# Patient Record
Sex: Male | Born: 1960 | Race: White | Hispanic: No | Smoking: Never smoker
Health system: Southern US, Community
[De-identification: ages and names within clinical notes are randomized; demographics above are authoritative.]

## PROBLEM LIST (undated history)

## (undated) DIAGNOSIS — E1140 Type 2 diabetes mellitus with diabetic neuropathy, unspecified: Secondary | ICD-10-CM

## (undated) DIAGNOSIS — E785 Hyperlipidemia, unspecified: Secondary | ICD-10-CM

## (undated) DIAGNOSIS — E119 Type 2 diabetes mellitus without complications: Secondary | ICD-10-CM

## (undated) DIAGNOSIS — I10 Essential (primary) hypertension: Secondary | ICD-10-CM

---

## 1998-07-01 ENCOUNTER — Encounter: Admission: RE | Admit: 1998-07-01 | Discharge: 1998-09-29 | Payer: Self-pay | Admitting: Family Medicine

## 2001-11-13 ENCOUNTER — Ambulatory Visit (HOSPITAL_COMMUNITY): Admission: RE | Admit: 2001-11-13 | Discharge: 2001-11-13 | Payer: Self-pay | Admitting: Family Medicine

## 2009-09-02 ENCOUNTER — Ambulatory Visit: Payer: Self-pay | Admitting: Pulmonary Disease

## 2009-09-02 ENCOUNTER — Inpatient Hospital Stay (HOSPITAL_COMMUNITY): Admission: EM | Admit: 2009-09-02 | Discharge: 2009-09-09 | Payer: Self-pay | Admitting: Emergency Medicine

## 2010-05-19 ENCOUNTER — Emergency Department (HOSPITAL_COMMUNITY): Admission: EM | Admit: 2010-05-19 | Discharge: 2010-05-19 | Payer: Self-pay | Admitting: Emergency Medicine

## 2010-10-01 LAB — MAGNESIUM
Magnesium: 1.6 mg/dL (ref 1.5–2.5)
Magnesium: 1.9 mg/dL (ref 1.5–2.5)
Magnesium: 2 mg/dL (ref 1.5–2.5)

## 2010-10-01 LAB — CBC
HCT: 24.9 % — ABNORMAL LOW (ref 39.0–52.0)
HCT: 25 % — ABNORMAL LOW (ref 39.0–52.0)
HCT: 26.4 % — ABNORMAL LOW (ref 39.0–52.0)
HCT: 27.4 % — ABNORMAL LOW (ref 39.0–52.0)
Hemoglobin: 9 g/dL — ABNORMAL LOW (ref 13.0–17.0)
Hemoglobin: 9.5 g/dL — ABNORMAL LOW (ref 13.0–17.0)
MCHC: 34.2 g/dL (ref 30.0–36.0)
MCHC: 34.6 g/dL (ref 30.0–36.0)
MCV: 87.3 fL (ref 78.0–100.0)
MCV: 87.6 fL (ref 78.0–100.0)
Platelets: 279 10*3/uL (ref 150–400)
Platelets: 285 10*3/uL (ref 150–400)
Platelets: 304 10*3/uL (ref 150–400)
Platelets: 316 10*3/uL (ref 150–400)
RBC: 2.85 MIL/uL — ABNORMAL LOW (ref 4.22–5.81)
RBC: 3.02 MIL/uL — ABNORMAL LOW (ref 4.22–5.81)
RDW: 13.9 % (ref 11.5–15.5)
RDW: 14.1 % (ref 11.5–15.5)
RDW: 14.2 % (ref 11.5–15.5)
RDW: 14.3 % (ref 11.5–15.5)
WBC: 12.3 10*3/uL — ABNORMAL HIGH (ref 4.0–10.5)
WBC: 12.9 10*3/uL — ABNORMAL HIGH (ref 4.0–10.5)
WBC: 14.4 10*3/uL — ABNORMAL HIGH (ref 4.0–10.5)
WBC: 16.3 10*3/uL — ABNORMAL HIGH (ref 4.0–10.5)
WBC: 20.2 10*3/uL — ABNORMAL HIGH (ref 4.0–10.5)

## 2010-10-01 LAB — GLUCOSE, CAPILLARY
Glucose-Capillary: 140 mg/dL — ABNORMAL HIGH (ref 70–99)
Glucose-Capillary: 145 mg/dL — ABNORMAL HIGH (ref 70–99)
Glucose-Capillary: 148 mg/dL — ABNORMAL HIGH (ref 70–99)
Glucose-Capillary: 164 mg/dL — ABNORMAL HIGH (ref 70–99)
Glucose-Capillary: 172 mg/dL — ABNORMAL HIGH (ref 70–99)
Glucose-Capillary: 173 mg/dL — ABNORMAL HIGH (ref 70–99)
Glucose-Capillary: 234 mg/dL — ABNORMAL HIGH (ref 70–99)
Glucose-Capillary: 241 mg/dL — ABNORMAL HIGH (ref 70–99)
Glucose-Capillary: 250 mg/dL — ABNORMAL HIGH (ref 70–99)
Glucose-Capillary: 250 mg/dL — ABNORMAL HIGH (ref 70–99)
Glucose-Capillary: 255 mg/dL — ABNORMAL HIGH (ref 70–99)
Glucose-Capillary: 286 mg/dL — ABNORMAL HIGH (ref 70–99)
Glucose-Capillary: 289 mg/dL — ABNORMAL HIGH (ref 70–99)
Glucose-Capillary: 312 mg/dL — ABNORMAL HIGH (ref 70–99)
Glucose-Capillary: 332 mg/dL — ABNORMAL HIGH (ref 70–99)

## 2010-10-01 LAB — BASIC METABOLIC PANEL
BUN: 10 mg/dL (ref 6–23)
BUN: 12 mg/dL (ref 6–23)
BUN: 47 mg/dL — ABNORMAL HIGH (ref 6–23)
CO2: 18 mEq/L — ABNORMAL LOW (ref 19–32)
CO2: 18 mEq/L — ABNORMAL LOW (ref 19–32)
CO2: 20 mEq/L (ref 19–32)
CO2: 25 mEq/L (ref 19–32)
Calcium: 7.7 mg/dL — ABNORMAL LOW (ref 8.4–10.5)
Calcium: 8.2 mg/dL — ABNORMAL LOW (ref 8.4–10.5)
Calcium: 8.4 mg/dL (ref 8.4–10.5)
Chloride: 103 mEq/L (ref 96–112)
Chloride: 95 mEq/L — ABNORMAL LOW (ref 96–112)
Creatinine, Ser: 1.19 mg/dL (ref 0.4–1.5)
Creatinine, Ser: 3.29 mg/dL — ABNORMAL HIGH (ref 0.4–1.5)
Creatinine, Ser: 3.64 mg/dL — ABNORMAL HIGH (ref 0.4–1.5)
GFR calc Af Amer: >60 mL/min (ref >60)
GFR calc non Af Amer: 18 mL/min — ABNORMAL LOW (ref >60)
GFR calc non Af Amer: 20 mL/min — ABNORMAL LOW (ref >60)
GFR calc non Af Amer: 42 mL/min — ABNORMAL LOW (ref >60)
GFR calc non Af Amer: >60 mL/min (ref >60)
GFR calc non Af Amer: >60 mL/min (ref >60)
Glucose, Bld: 168 mg/dL — ABNORMAL HIGH (ref 70–99)
Glucose, Bld: 193 mg/dL — ABNORMAL HIGH (ref 70–99)
Glucose, Bld: 260 mg/dL — ABNORMAL HIGH (ref 70–99)
Potassium: 4.1 mEq/L (ref 3.5–5.1)
Potassium: 4.2 mEq/L (ref 3.5–5.1)
Potassium: 4.3 mEq/L (ref 3.5–5.1)
Sodium: 135 mEq/L (ref 135–145)
Sodium: 136 mEq/L (ref 135–145)
Sodium: 140 mEq/L (ref 135–145)

## 2010-10-01 LAB — DIFFERENTIAL
Basophils Absolute: 0 10*3/uL (ref 0.0–0.1)
Eosinophils Relative: 1 % (ref 0–5)
Lymphocytes Relative: 5 % — ABNORMAL LOW (ref 12–46)
Lymphs Abs: 1.1 10*3/uL (ref 0.7–4.0)
Monocytes Absolute: 2.2 10*3/uL — ABNORMAL HIGH (ref 0.1–1.0)
Monocytes Relative: 11 % (ref 3–12)
Neutro Abs: 16.6 10*3/uL — ABNORMAL HIGH (ref 1.7–7.7)

## 2010-10-01 LAB — CORTISOL: Cortisol, Plasma: 24 ug/dL

## 2010-10-01 LAB — RETICULOCYTES
RBC.: 3.15 MIL/uL — ABNORMAL LOW (ref 4.22–5.81)
Retic Ct Pct: 1 % (ref 0.4–3.1)

## 2010-10-01 LAB — URINALYSIS, ROUTINE W REFLEX MICROSCOPIC
Glucose, UA: NEGATIVE mg/dL
Hgb urine dipstick: NEGATIVE
Nitrite: NEGATIVE
Protein, ur: NEGATIVE mg/dL
pH: 5 (ref 5.0–8.0)

## 2010-10-01 LAB — CROSSMATCH: Antibody Screen: NEGATIVE

## 2010-10-01 LAB — CULTURE, BLOOD (ROUTINE X 2)

## 2010-10-01 LAB — IRON AND TIBC: TIBC: 176 ug/dL — ABNORMAL LOW (ref 215–435)

## 2010-10-01 LAB — FERRITIN: Ferritin: 296 ng/mL (ref 22–322)

## 2010-10-01 LAB — LACTIC ACID, PLASMA: Lactic Acid, Venous: 1.7 mmol/L (ref 0.5–2.2)

## 2010-10-01 LAB — PHOSPHORUS
Phosphorus: 1.7 mg/dL — ABNORMAL LOW (ref 2.3–4.6)
Phosphorus: 2.8 mg/dL (ref 2.3–4.6)
Phosphorus: 4.2 mg/dL (ref 2.3–4.6)

## 2010-10-01 LAB — FOLATE: Folate: 7.5 ng/mL

## 2010-10-01 LAB — HEMOGLOBIN A1C: Hgb A1c MFr Bld: 9 % — ABNORMAL HIGH (ref 4.6–6.1)

## 2010-10-04 LAB — BASIC METABOLIC PANEL
CO2: 27 mEq/L (ref 19–32)
Calcium: 8.5 mg/dL (ref 8.4–10.5)
GFR calc Af Amer: >60 mL/min (ref >60)
GFR calc non Af Amer: 56 mL/min — ABNORMAL LOW (ref >60)
Potassium: 3.7 mEq/L (ref 3.5–5.1)
Sodium: 137 mEq/L (ref 135–145)

## 2010-10-04 LAB — GLUCOSE, CAPILLARY
Glucose-Capillary: 165 mg/dL — ABNORMAL HIGH (ref 70–99)
Glucose-Capillary: 208 mg/dL — ABNORMAL HIGH (ref 70–99)
Glucose-Capillary: 322 mg/dL — ABNORMAL HIGH (ref 70–99)
Glucose-Capillary: 85 mg/dL (ref 70–99)

## 2010-10-04 LAB — CBC
Hemoglobin: 9.4 g/dL — ABNORMAL LOW (ref 13.0–17.0)
RBC: 3.13 MIL/uL — ABNORMAL LOW (ref 4.22–5.81)

## 2010-11-25 ENCOUNTER — Encounter (HOSPITAL_BASED_OUTPATIENT_CLINIC_OR_DEPARTMENT_OTHER): Payer: Managed Care, Other (non HMO) | Attending: Internal Medicine

## 2010-11-25 DIAGNOSIS — E785 Hyperlipidemia, unspecified: Secondary | ICD-10-CM | POA: Insufficient documentation

## 2010-11-25 DIAGNOSIS — Z79899 Other long term (current) drug therapy: Secondary | ICD-10-CM | POA: Insufficient documentation

## 2010-11-25 DIAGNOSIS — I10 Essential (primary) hypertension: Secondary | ICD-10-CM | POA: Insufficient documentation

## 2010-11-25 DIAGNOSIS — E1169 Type 2 diabetes mellitus with other specified complication: Secondary | ICD-10-CM | POA: Insufficient documentation

## 2010-11-25 DIAGNOSIS — E1149 Type 2 diabetes mellitus with other diabetic neurological complication: Secondary | ICD-10-CM | POA: Insufficient documentation

## 2010-11-25 DIAGNOSIS — E1142 Type 2 diabetes mellitus with diabetic polyneuropathy: Secondary | ICD-10-CM | POA: Insufficient documentation

## 2010-11-25 DIAGNOSIS — L97509 Non-pressure chronic ulcer of other part of unspecified foot with unspecified severity: Secondary | ICD-10-CM | POA: Insufficient documentation

## 2010-11-26 NOTE — Assessment & Plan Note (Signed)
[facility]  NAME:  Calvin Macias, Calvin Macias                ACCOUNT NO.:  [id number]  MEDICAL RECORD NO.:  [id number]      DATE OF BIRTH:  04-11-1961  PHYSICIAN:  Maxwell Caul, M.D. VISIT DATE:  11/25/2010                                  OFFICE VISIT   [name] is a 49 year old man who comes here for a review of an area on his right medial foot.  He tells me that he became suddenly ill in February 2011.  He developed a rapidly progressive infection in the right medial foot, also acute renal failure.  He was admitted to [facility] for 8 days and discharged on IV antibiotics for 2 weeks and then subsequently p.o. antibiotics for 2 weeks.  His debridement was done in the hospital by Dr. Lequita Halt.  He states that he was followed there up until August 2011 with wet-to-dry dressings and gradually over time this completely epithelialized over, except for a hypertrophied area over the original wound area.  This is intermittently scabbed over and bled, however, it is never by his description completely closed over and what we are looking at today roughly has been the same for 3 months now.  The patient is a diabetic with peripheral neuropathy and he has a right Charcot foot.  PAST MEDICAL HISTORY:  Includes, hypertension, type 2 diabetes with neuropathy, hyperlipidemia, depression.  MEDICATION:  List is reviewed.  He is on glipizide, metformin, Actos, Januvia for his diabetes.  He is on simvastatin and benazepril.  PHYSICAL EXAMINATION:  His temperature is 98.4, pulse 99, respirations 19, blood pressure is 144/79, CBG 89.  The area over his right medial foot had a linear hypertrophied area over the original wound.  This was surrounded by scar, however, there were no obvious open areas.  There was discoloration under the hypertrophied area, which I thought actually was dried blood.  I removed the thick eschar.  The area were I thought there would be an open area actually was  fully epithelialized.  However in the lower aspect of the original wound, there is a small open area. This was not infected.  Circulation:  His peripheral pulses are robust.  His capillary refill time is normal.  He has subluxed metatarsal heads and quite obviously a Charcot foot on the right.  IMPRESSION:  Surgical wound, nonhealing.  I have removed the eschar from the surface of this wound.  There is a small open area which I think was probably the source of the bleeding that he was saying.  Surprisingly, the area looked quite well underneath.  We applied collagen hydrogel to this area and we will see him again in a week.  I am really not of the opinion that this will be at all problematic to close over.  There is no evidence of infection.  We did discuss the issue of diabetic shoes.  He is walking on a treadmill and fairly active.  We will see if we can get this covered through his insurance "[employer]."          ______________________________ Maxwell Caul, M.D.     MGR/MEDQ  D:  11/25/2010  T:  11/26/2010  Job:  [id number]

## 2010-12-30 ENCOUNTER — Encounter (HOSPITAL_BASED_OUTPATIENT_CLINIC_OR_DEPARTMENT_OTHER): Payer: Managed Care, Other (non HMO) | Attending: Internal Medicine

## 2010-12-30 DIAGNOSIS — L97509 Non-pressure chronic ulcer of other part of unspecified foot with unspecified severity: Secondary | ICD-10-CM | POA: Insufficient documentation

## 2010-12-30 DIAGNOSIS — E1169 Type 2 diabetes mellitus with other specified complication: Secondary | ICD-10-CM | POA: Insufficient documentation

## 2010-12-30 DIAGNOSIS — I10 Essential (primary) hypertension: Secondary | ICD-10-CM | POA: Insufficient documentation

## 2010-12-30 DIAGNOSIS — E1149 Type 2 diabetes mellitus with other diabetic neurological complication: Secondary | ICD-10-CM | POA: Insufficient documentation

## 2010-12-30 DIAGNOSIS — Z79899 Other long term (current) drug therapy: Secondary | ICD-10-CM | POA: Insufficient documentation

## 2010-12-30 DIAGNOSIS — E785 Hyperlipidemia, unspecified: Secondary | ICD-10-CM | POA: Insufficient documentation

## 2010-12-30 DIAGNOSIS — E1142 Type 2 diabetes mellitus with diabetic polyneuropathy: Secondary | ICD-10-CM | POA: Insufficient documentation

## 2013-09-16 ENCOUNTER — Ambulatory Visit
Admission: RE | Admit: 2013-09-16 | Discharge: 2013-09-16 | Disposition: A | Discharge status: Home / Self Care | Payer: Managed Care, Other (non HMO) | Source: Ambulatory Visit | Attending: Internal Medicine | Admitting: Internal Medicine

## 2013-09-16 ENCOUNTER — Other Ambulatory Visit: Payer: Self-pay | Admitting: Internal Medicine

## 2013-09-16 DIAGNOSIS — E1149 Type 2 diabetes mellitus with other diabetic neurological complication: Secondary | ICD-10-CM

## 2013-09-16 DIAGNOSIS — L97509 Non-pressure chronic ulcer of other part of unspecified foot with unspecified severity: Secondary | ICD-10-CM

## 2014-03-19 ENCOUNTER — Emergency Department (HOSPITAL_COMMUNITY): Payer: Managed Care, Other (non HMO)

## 2014-03-19 ENCOUNTER — Encounter (HOSPITAL_COMMUNITY): Payer: Self-pay | Admitting: Emergency Medicine

## 2014-03-19 ENCOUNTER — Inpatient Hospital Stay (HOSPITAL_COMMUNITY)
Admission: EM | Admit: 2014-03-19 | Discharge: 2014-03-24 | DRG: 871 | Disposition: A | Discharge status: Home / Self Care | Payer: Managed Care, Other (non HMO) | Attending: Internal Medicine | Admitting: Internal Medicine

## 2014-03-19 DIAGNOSIS — Z833 Family history of diabetes mellitus: Secondary | ICD-10-CM

## 2014-03-19 DIAGNOSIS — A419 Sepsis, unspecified organism: Secondary | ICD-10-CM | POA: Diagnosis present

## 2014-03-19 DIAGNOSIS — Z7982 Long term (current) use of aspirin: Secondary | ICD-10-CM

## 2014-03-19 DIAGNOSIS — L97509 Non-pressure chronic ulcer of other part of unspecified foot with unspecified severity: Secondary | ICD-10-CM | POA: Diagnosis present

## 2014-03-19 DIAGNOSIS — Z79899 Other long term (current) drug therapy: Secondary | ICD-10-CM | POA: Diagnosis not present

## 2014-03-19 DIAGNOSIS — E1310 Other specified diabetes mellitus with ketoacidosis without coma: Secondary | ICD-10-CM | POA: Diagnosis present

## 2014-03-19 DIAGNOSIS — E1149 Type 2 diabetes mellitus with other diabetic neurological complication: Secondary | ICD-10-CM | POA: Diagnosis present

## 2014-03-19 DIAGNOSIS — M79609 Pain in unspecified limb: Secondary | ICD-10-CM | POA: Diagnosis present

## 2014-03-19 DIAGNOSIS — E785 Hyperlipidemia, unspecified: Secondary | ICD-10-CM | POA: Diagnosis present

## 2014-03-19 DIAGNOSIS — L089 Local infection of the skin and subcutaneous tissue, unspecified: Secondary | ICD-10-CM

## 2014-03-19 DIAGNOSIS — N179 Acute kidney failure, unspecified: Secondary | ICD-10-CM

## 2014-03-19 DIAGNOSIS — E1169 Type 2 diabetes mellitus with other specified complication: Secondary | ICD-10-CM | POA: Diagnosis present

## 2014-03-19 DIAGNOSIS — E11621 Type 2 diabetes mellitus with foot ulcer: Secondary | ICD-10-CM

## 2014-03-19 DIAGNOSIS — Z91199 Patient's noncompliance with other medical treatment and regimen due to unspecified reason: Secondary | ICD-10-CM | POA: Diagnosis not present

## 2014-03-19 DIAGNOSIS — E119 Type 2 diabetes mellitus without complications: Secondary | ICD-10-CM | POA: Diagnosis present

## 2014-03-19 DIAGNOSIS — I10 Essential (primary) hypertension: Secondary | ICD-10-CM

## 2014-03-19 DIAGNOSIS — E1110 Type 2 diabetes mellitus with ketoacidosis without coma: Secondary | ICD-10-CM

## 2014-03-19 DIAGNOSIS — E1142 Type 2 diabetes mellitus with diabetic polyneuropathy: Secondary | ICD-10-CM | POA: Diagnosis present

## 2014-03-19 DIAGNOSIS — L03119 Cellulitis of unspecified part of limb: Secondary | ICD-10-CM

## 2014-03-19 DIAGNOSIS — R6520 Severe sepsis without septic shock: Secondary | ICD-10-CM | POA: Diagnosis present

## 2014-03-19 DIAGNOSIS — L02619 Cutaneous abscess of unspecified foot: Secondary | ICD-10-CM | POA: Diagnosis present

## 2014-03-19 DIAGNOSIS — E118 Type 2 diabetes mellitus with unspecified complications: Secondary | ICD-10-CM

## 2014-03-19 DIAGNOSIS — R6521 Severe sepsis with septic shock: Secondary | ICD-10-CM

## 2014-03-19 DIAGNOSIS — Z9119 Patient's noncompliance with other medical treatment and regimen: Secondary | ICD-10-CM

## 2014-03-19 HISTORY — DX: Essential (primary) hypertension: I10

## 2014-03-19 HISTORY — DX: Type 2 diabetes mellitus without complications: E11.9

## 2014-03-19 HISTORY — DX: Hyperlipidemia, unspecified: E78.5

## 2014-03-19 HISTORY — DX: Type 2 diabetes mellitus with diabetic neuropathy, unspecified: E11.40

## 2014-03-19 LAB — COMPREHENSIVE METABOLIC PANEL
ALT: 16 U/L (ref 0–53)
AST: 17 U/L (ref 0–37)
Albumin: 3.6 g/dL (ref 3.5–5.2)
Alkaline Phosphatase: 73 U/L (ref 39–117)
Anion gap: 20 — ABNORMAL HIGH (ref 5–15)
BUN: 61 mg/dL — ABNORMAL HIGH (ref 6–23)
CO2: 17 meq/L — AB (ref 19–32)
CREATININE: 3.21 mg/dL — AB (ref 0.50–1.35)
Calcium: 9.3 mg/dL (ref 8.4–10.5)
Chloride: 93 mEq/L — ABNORMAL LOW (ref 96–112)
GFR, EST AFRICAN AMERICAN: 24 mL/min — AB (ref >90)
GFR, EST NON AFRICAN AMERICAN: 21 mL/min — AB (ref >90)
GLUCOSE: 250 mg/dL — AB (ref 70–99)
Potassium: 5.5 mEq/L — ABNORMAL HIGH (ref 3.7–5.3)
SODIUM: 130 meq/L — AB (ref 137–147)
Total Bilirubin: 0.5 mg/dL (ref 0.3–1.2)
Total Protein: 7.5 g/dL (ref 6.0–8.3)

## 2014-03-19 LAB — CBC WITH DIFFERENTIAL/PLATELET
Basophils Absolute: 0 10*3/uL (ref 0.0–0.1)
Basophils Relative: 0 % (ref 0–1)
EOS PCT: 0 % (ref 0–5)
Eosinophils Absolute: 0 10*3/uL (ref 0.0–0.7)
HEMATOCRIT: 31.5 % — AB (ref 39.0–52.0)
HEMOGLOBIN: 11.1 g/dL — AB (ref 13.0–17.0)
LYMPHS ABS: 1.6 10*3/uL (ref 0.7–4.0)
LYMPHS PCT: 9 % — AB (ref 12–46)
MCH: 29.9 pg (ref 26.0–34.0)
MCHC: 35.2 g/dL (ref 30.0–36.0)
MCV: 84.9 fL (ref 78.0–100.0)
MONO ABS: 2.2 10*3/uL — AB (ref 0.1–1.0)
Monocytes Relative: 12 % (ref 3–12)
Neutro Abs: 15.2 10*3/uL — ABNORMAL HIGH (ref 1.7–7.7)
Neutrophils Relative %: 79 % — ABNORMAL HIGH (ref 43–77)
Platelets: 214 10*3/uL (ref 150–400)
RBC: 3.71 MIL/uL — AB (ref 4.22–5.81)
RDW: 13.8 % (ref 11.5–15.5)
WBC: 19.1 10*3/uL — AB (ref 4.0–10.5)

## 2014-03-19 LAB — URINE MICROSCOPIC-ADD ON

## 2014-03-19 LAB — URINALYSIS, ROUTINE W REFLEX MICROSCOPIC
Bilirubin Urine: NEGATIVE
Glucose, UA: >1000 mg/dL — AB
Hgb urine dipstick: NEGATIVE
Ketones, ur: 15 mg/dL — AB
LEUKOCYTES UA: NEGATIVE
NITRITE: NEGATIVE
Protein, ur: NEGATIVE mg/dL
SPECIFIC GRAVITY, URINE: 1.021 (ref 1.005–1.030)
Urobilinogen, UA: 0.2 mg/dL (ref 0.0–1.0)
pH: 5 (ref 5.0–8.0)

## 2014-03-19 LAB — I-STAT CG4 LACTIC ACID, ED: Lactic Acid, Venous: 1.2 mmol/L (ref 0.5–2.2)

## 2014-03-19 MED ORDER — PIPERACILLIN-TAZOBACTAM 3.375 G IVPB 30 MIN
3.3750 g | Freq: Once | INTRAVENOUS | Status: AC
  Administered 2014-03-19: 3.375 g via INTRAVENOUS
  Filled 2014-03-19: qty 50

## 2014-03-19 MED ORDER — SODIUM CHLORIDE 0.9 % IV BOLUS (SEPSIS): 2000.0000 mL | Freq: Once | INTRAVENOUS | Status: AC

## 2014-03-19 MED ORDER — SODIUM CHLORIDE 0.9 % IV SOLN
1000.0000 mL | INTRAVENOUS | Status: DC
  Administered 2014-03-19: 1000 mL via INTRAVENOUS

## 2014-03-19 MED ORDER — VANCOMYCIN HCL 10 G IV SOLR
1250.0000 mg | INTRAVENOUS | Status: DC
  Administered 2014-03-19 – 2014-03-20 (×2): 1250 mg via INTRAVENOUS
  Filled 2014-03-19 (×3): qty 1250

## 2014-03-19 MED ORDER — VANCOMYCIN HCL IN DEXTROSE 1-5 GM/200ML-% IV SOLN
1000.0000 mg | Freq: Once | INTRAVENOUS | Status: DC
Start: 2014-03-19 — End: 2014-03-19
  Filled 2014-03-19: qty 200

## 2014-03-19 MED ORDER — ONDANSETRON HCL 4 MG/2ML IJ SOLN
4.0000 mg | Freq: Once | INTRAMUSCULAR | Status: AC
  Administered 2014-03-19: 4 mg via INTRAVENOUS
  Filled 2014-03-19: qty 2

## 2014-03-19 MED ORDER — SODIUM CHLORIDE 0.9 % IV BOLUS (SEPSIS)
1000.0000 mL | Freq: Once | INTRAVENOUS | Status: AC
  Administered 2014-03-19: 1000 mL via INTRAVENOUS

## 2014-03-19 MED ORDER — SODIUM CHLORIDE 0.9 % IV BOLUS (SEPSIS)
30.0000 mL/kg | Freq: Once | INTRAVENOUS | Status: AC
  Administered 2014-03-19: 1000 mL via INTRAVENOUS

## 2014-03-19 MED ORDER — ACETAMINOPHEN 500 MG PO TABS
1000.0000 mg | ORAL_TABLET | Freq: Once | ORAL | Status: AC
  Administered 2014-03-19: 1000 mg via ORAL
  Filled 2014-03-19: qty 2

## 2014-03-19 MED ORDER — PIPERACILLIN-TAZOBACTAM 3.375 G IVPB
3.3750 g | Freq: Three times a day (TID) | INTRAVENOUS | Status: DC
  Administered 2014-03-20 – 2014-03-24 (×13): 3.375 g via INTRAVENOUS
  Filled 2014-03-19 (×17): qty 50

## 2014-03-19 MED ORDER — MORPHINE SULFATE 4 MG/ML IJ SOLN
4.0000 mg | Freq: Once | INTRAMUSCULAR | Status: AC
  Administered 2014-03-19: 4 mg via INTRAVENOUS
  Filled 2014-03-19: qty 1

## 2014-03-19 NOTE — ED Notes (Signed)
Patient being transported by [facility], EMT.

## 2014-03-19 NOTE — ED Notes (Signed)
Pt c/o right foot pain and wound with infection; pt sts wound x 8 months; pt with fever and foul smelling discharge

## 2014-03-19 NOTE — Consult Note (Signed)
Reason for Consult:  Right diabetic foot infection Referring Physician:   EDP  Calvin Macias is an 53 Macias.o. male.  HPI:   53 yo male diabetic with questionable good diabetic control who reports worsening right great toe swelling and pain over the last 3 days.  He has had a small plantar wound for about a year now.  He did have remote right foot surgery for an infection about 5 years ago.  With worsening redness, swelling, and pain as well as him starting to feel sick, he came to the ED for further evaluation and treatment.  Past Medical History  Diagnosis Date  . Diabetes mellitus without complication     History reviewed. No pertinent past surgical history.  Family History  Problem Relation Age of Onset  . Hyperlipidemia Mother   . Diabetes Father     Social History:  reports that he has never smoked. He does not have any smokeless tobacco history on file. He reports that he does not drink alcohol or use illicit drugs.  Allergies: No Known Allergies  Medications: I have reviewed the patient's current medications.  Results for orders placed during the hospital encounter of 03/19/14 (from the past 48 hour(s))  CBC WITH DIFFERENTIAL     Status: Abnormal   Collection Time    03/19/14  5:40 PM      Result Value Ref Range   WBC 19.1 (*) 4.0 - 10.5 K/uL   RBC 3.71 (*) 4.22 - 5.81 MIL/uL   Hemoglobin 11.1 (*) 13.0 - 17.0 g/dL   HCT 31.5 (*) 39.0 - 52.0 %   MCV 84.9  78.0 - 100.0 fL   MCH 29.9  26.0 - 34.0 pg   MCHC 35.2  30.0 - 36.0 g/dL   RDW 13.8  11.5 - 15.5 %   Platelets 214  150 - 400 K/uL   Neutrophils Relative % 79 (*) 43 - 77 %   Neutro Abs 15.2 (*) 1.7 - 7.7 K/uL   Lymphocytes Relative 9 (*) 12 - 46 %   Lymphs Abs 1.6  0.7 - 4.0 K/uL   Monocytes Relative 12  3 - 12 %   Monocytes Absolute 2.2 (*) 0.1 - 1.0 K/uL   Eosinophils Relative 0  0 - 5 %   Eosinophils Absolute 0.0  0.0 - 0.7 K/uL   Basophils Relative 0  0 - 1 %   Basophils Absolute 0.0  0.0 - 0.1 K/uL    COMPREHENSIVE METABOLIC PANEL     Status: Abnormal   Collection Time    03/19/14  5:40 PM      Result Value Ref Range   Sodium 130 (*) 137 - 147 mEq/L   Potassium 5.5 (*) 3.7 - 5.3 mEq/L   Chloride 93 (*) 96 - 112 mEq/L   CO2 17 (*) 19 - 32 mEq/L   Glucose, Bld 250 (*) 70 - 99 mg/dL   BUN 61 (*) 6 - 23 mg/dL   Creatinine, Ser 3.21 (*) 0.50 - 1.35 mg/dL   Calcium 9.3  8.4 - 10.5 mg/dL   Total Protein 7.5  6.0 - 8.3 g/dL   Albumin 3.6  3.5 - 5.2 g/dL   AST 17  0 - 37 U/L   ALT 16  0 - 53 U/L   Alkaline Phosphatase 73  39 - 117 U/L   Total Bilirubin 0.5  0.3 - 1.2 mg/dL   GFR calc non Af Amer 21 (*) >90 mL/min   GFR calc Af  Amer 24 (*) >90 mL/min   Comment: (NOTE)     The eGFR has been calculated using the CKD EPI equation.     This calculation has not been validated in all clinical situations.     eGFR's persistently <90 mL/min signify possible Chronic Kidney     Disease.   Anion gap 20 (*) 5 - 15  I-STAT CG4 LACTIC ACID, ED     Status: None   Collection Time    03/19/14  5:52 PM      Result Value Ref Range   Lactic Acid, Venous 1.20  0.5 - 2.2 mmol/L  URINALYSIS, ROUTINE W REFLEX MICROSCOPIC     Status: Abnormal   Collection Time    03/19/14 10:08 PM      Result Value Ref Range   Color, Urine YELLOW  YELLOW   APPearance HAZY (*) CLEAR   Specific Gravity, Urine 1.021  1.005 - 1.030   pH 5.0  5.0 - 8.0   Glucose, UA >1000 (*) NEGATIVE mg/dL   Hgb urine dipstick NEGATIVE  NEGATIVE   Bilirubin Urine NEGATIVE  NEGATIVE   Ketones, ur 15 (*) NEGATIVE mg/dL   Protein, ur NEGATIVE  NEGATIVE mg/dL   Urobilinogen, UA 0.2  0.0 - 1.0 mg/dL   Nitrite NEGATIVE  NEGATIVE   Leukocytes, UA NEGATIVE  NEGATIVE  URINE MICROSCOPIC-ADD ON     Status: Abnormal   Collection Time    03/19/14 10:08 PM      Result Value Ref Range   Squamous Epithelial / LPF RARE  RARE   WBC, UA 0-2  <3 WBC/hpf   RBC / HPF 0-2  <3 RBC/hpf   Bacteria, UA RARE  RARE   Casts HYALINE CASTS (*) NEGATIVE    Urine-Other MUCOUS PRESENT      Dg Foot Complete Right  03/19/2014   CLINICAL DATA:  Foot pain for 5 years, history diabetes  EXAM: RIGHT FOOT COMPLETE - 3+ VIEW  COMPARISON:  09/01/2009  FINDINGS: Dressing artifacts at great toe.  Remaining joint spaces preserved.  No acute fracture or dislocation.  Soft tissue swelling great toe with multiple foci of soft tissue gas consistent with soft tissue infection by gas-forming organism.  While some of the observed soft tissue gas is in close proximity to bones within the great toe, no definite bone destruction is seen to suggest osteomyelitis.  Old corticated erosion at the medial margin of the base of the proximal phalanx great toe question related to degenerative changes or an inflammatory arthropathy such as gout.  Diffuse osseous demineralization.  Advanced degenerative changes of first MTP joint.  Narrowing in destruction with bony debris at intertarsal joints compatible with a Charcot changes with midfoot collapse and pes planus.  Large plantar calcaneal spur.  IMPRESSION: Extensive soft tissue swelling in the great toe a region with multiple foci of soft tissue gas compatible with infection by a gas-forming organism.  No definite evidence of osteomyelitis though if this remains a clinical concern recommend MR imaging.  Advanced Charcot changes of the midfoot.   Electronically Signed   By: Lavonia Dana M.D.   On: 03/19/2014 18:13    ROS Blood pressure 90/43, pulse 97, temperature 100.1 F (37.8 C), temperature source Rectal, resp. rate 19, height 6' 1"  (1.854 m), weight 96.616 kg (213 lb), SpO2 96.00%. Physical Exam  Musculoskeletal:       Feet:   Obvious diabetic neuropathy. Foot with decent perfusion   Assessment/Plan: Right diabetic foot infection with obvious  abscess 1)  In the ED I was able to open up the medial aspect of the right great toe easily and found significant gross purulence.  I also deeply probed the plantar ulcer and then  connected this with the medial side.  Next I was able to irrigate the wound deeply with a liter of normal saline solution using a syringe.  I finally pack the wounds with a wet-to-dry gauze. 2)  He will be admitted to the Medicine Service and started on IV antibiotics 3)  I will consult the physical therapy wound service tomorrow am to start daily hydrotherapy to his right foot so that it can be thoroughly irrigated out the next 2 days.  He still may need a trip to the OR, however, thus far I was able to adequately decompress the abscess in the ED.  Calvin Macias 03/19/2014, 11:07 PM

## 2014-03-19 NOTE — ED Provider Notes (Signed)
This patient was seen in conjunction with physician assistant.  The patient presents with fever to 101, severe swelling in his right great toe. Also associated vomiting and malaise.  Physical exam patient is 4 and alert and awake male. He is ambulatory into the emergency department.  Lungs are clear without wheeze rhonchi rail Heart is tachycardic and regular Abdomen is soft and nontender Back is normal visual inspection without any CVA tenderness Lower extremities show very large swelling of the first great toe on the right foot. This extends up to approximately the distal one third of the forefoot and includes the metatarsal head and prominence on the sole of the foot. There is foul smelling discharge. The entire area is very erythematous. Swelling is at minimum 3 times normal size the great toe.  Findings are consistent with early sepsis with fever and tachycardia. The foot exam combined with the x-ray results suggest severe infection with gas in the soft tissues. The patient be treated for diabetic complex foot infection with probable bacteremia versus early sepsis.  CRITICAL CARE Performed by: Arby Barrette   Total critical care time: 30 minures  Critical care time was exclusive of separately billable procedures and treating other patients.  Critical care was necessary to treat or prevent imminent or life-threatening deterioration.  Critical care was time spent personally by me on the following activities: development of treatment plan with patient and/or surrogate as well as nursing, discussions with consultants, evaluation of patient's response to treatment, examination of patient, obtaining history from patient or surrogate, ordering and performing treatments and interventions, ordering and review of laboratory studies, ordering and review of radiographic studies, pulse oximetry and re-evaluation of patient's condition.   Arby Barrette, MD 03/19/14 2043

## 2014-03-19 NOTE — Progress Notes (Signed)
ANTIBIOTIC CONSULT NOTE - INITIAL  Pharmacy Consult for Vancomycin and zosyn Indication: rule out sepsis/wound infection  No Known Allergies  Patient Measurements: Height:  (185.4 cm) Weight: 213 lb (96.616 kg) IBW/kg (Calculated) : 79.9   Vital Signs: Temp: 99.9 F (37.7 C) (09/09 1727) Temp src: Oral (09/09 1727) BP: 103/47 mmHg (09/09 1727) Pulse Rate: 114 (09/09 1727) Intake/Output from previous day:   Intake/Output from this shift:    Labs:  Recent Labs  03/19/14 1740  WBC 19.1*  HGB 11.1*  PLT 214  CREATININE 3.21*   Estimated Creatinine Clearance: 32.6 ml/min (by C-G formula based on Cr of 3.21). No results found for this basename: VANCOTROUGH, VANCOPEAK, VANCORANDOM, GENTTROUGH, GENTPEAK, GENTRANDOM, TOBRATROUGH, TOBRAPEAK, TOBRARND, AMIKACINPEAK, AMIKACINTROU, AMIKACIN,  in the last 72 hours   Microbiology: No results found for this or any previous visit (from the past 720 hour(s)).  Medical History: Past Medical History  Diagnosis Date  . Diabetes mellitus without complication     Assessment: 31 YOM with hx of DM presented with right foot pain and wound infection, temp 99.9, leukocytosis, wbc 191.1, hypotensive. pharmacy is consulted to start vancomycin and zosyn empirically. Urine and blood cultures are ordered. Scr elevated at 3.21 (unclear baseline), est. crcl ~ 30 ml/min. Vancomycin 1g and zosyn 3.375 g was ordered while in the ED  Goal of Therapy:  Vancomycin trough level 15-20 mcg/ml  Plan:  - Change vancomycin to 1250 mg IV Q 24 hrs - zosyn 3.375 g IV Q 8 hrs, next dose 0400 - f/u renal function and cultures - vancomycin trough at steady state if indicated.  Bayard Hugger, PharmD, BCPS  Clinical Pharmacist  Pager: [phone]   03/19/2014,8:29 PM

## 2014-03-19 NOTE — ED Notes (Signed)
Orthopedic MD at the bedside. 

## 2014-03-19 NOTE — H&P (Addendum)
[facility] History and Physical  Calvin Macias ZOX:[id number] DOB: [birth year] DOA: 03/19/2014  Referring physician: ED physician PCP: Frederich Chick, MD  Specialists:   Chief Complaint: Right foot pain  HPI: Calvin Macias is a 53 y.o. male with past medical history of type II diabetes, Charcot foot, neuropathy, HTN, HLD, who presents with right foot pain.   Patient reports that he started feeling bad 4 days ago, including fever, nausea and vomiting. He reports fever of 101 at 3 days ago. He vomited several times. In the following day, he noticed that his right great toe become swollen. It has been progressively getting worse. Patient states that he had a right foot callous removed by podiatrist in November. After that he has been using antibiotic cream. Patient does not have cough, chest pain, abdominal pain, diarrhea. No change in urinary habit. No rashes. X-ray showed extensive soft tissue swelling in the great toe a region with multiple foci of soft tissue gas compatible with infection by a gas-forming organism.   He is a very tachycardic. He has leukocytosis with WBC 19.1. He also found to have worsening renal function with creatinine up from 1.35 on 09/09/09 to 3.21 on admission. Lactic acid 1.2. Patient was treated with IV fluid bolus, and started with vancomycin and Zosyn, admitted to inpatient.   Review of Systems: As presented in the history of presenting illness, rest negative.  Where does patient live?  Lives with wife in [place] Can patient participate in ADLs? Yes  Allergy: No Known Allergies  Past Medical History  Diagnosis Date  . Diabetes mellitus without complication     History reviewed. No pertinent past surgical history.  Social History:  reports that he has never smoked. He does not have any smokeless tobacco history on file. He reports that he does not drink alcohol or use illicit drugs.  Family History:  Family History  Problem Relation Age of Onset   . Hyperlipidemia Mother   . Diabetes Father      Prior to Admission medications   Medication Sig Start Date End Date Taking? Authorizing Provider  aspirin 81 MG tablet Take 81 mg by mouth every morning.   Yes Historical Provider, MD  benazepril (LOTENSIN) 20 MG tablet Take 20 mg by mouth daily. 12/16/13  Yes Historical Provider, MD  glipiZIDE (GLUCOTROL) 10 MG tablet Take 10 tablets by mouth 2 (two) times daily. 12/16/13  Yes Historical Provider, MD  hydrochlorothiazide (MICROZIDE) 12.5 MG capsule Take 12.5 mg by mouth daily. 12/16/13  Yes Historical Provider, MD  INVOKANA 100 MG TABS Take 100 mg by mouth daily. 01/22/14  Yes Historical Provider, MD  metFORMIN (GLUCOPHAGE) 1000 MG tablet Take 1,000 mg by mouth 2 (two) times daily. 12/16/13  Yes Historical Provider, MD  Multiple Vitamins-Minerals (MENS LIFE PACK) TABS Take 1 each by mouth daily.   Yes Historical Provider, MD  pioglitazone (ACTOS) 45 MG tablet Take 45 mg by mouth daily. 12/16/13  Yes Historical Provider, MD  simvastatin (ZOCOR) 40 MG tablet Take 40 mg by mouth daily. 12/16/13  Yes Historical Provider, MD  VICTOZA 18 MG/3ML SOPN Inject 1.8 mg into the skin daily. 12/23/13  Yes Historical Provider, MD    Physical Exam: Filed Vitals:   03/19/14 2225 03/19/14 2230 03/19/14 2245 03/19/14 2300  BP: 90/43  Pulse: 104 103 102 97  Temp:      TempSrc:      Resp: Height:  Weight:      SpO2: 98% 95% 96% 96%   General: Not in acute distress HEENT:       Eyes: PERRL, EOMI, no scleral icterus       ENT: No discharge from the ears and nose, no pharynx injection, no tonsillar enlargement.        Neck: No JVD, no bruit, no mass felt. Cardiac: S1/S2, RRR, No murmurs, gallops or rubs Pulm: Good air movement bilaterally. Clear to auscultation bilaterally. No rales, wheezing, rhonchi or rubs. Abd: Soft, nondistended, nontender, no rebound pain, no organomegaly, BS present Ext: 2+DP/PT pulse bilaterally. There  right great toe is very swelling, at least 3 times of normal size the great toe. This extends up to approximately the distal one third of the forefoot and includes the metatarsal head and prominence on the sole of the foot. There is foul smelling discharge. The entire area is red.  Musculoskeletal: No joint deformities, erythema, or stiffness, ROM full Skin: No rashes.  Neuro: Alert and oriented X3, cranial nerves II-XII grossly intact, muscle strength 5/5 in all extremeties, sensation to light touch intact.  Psych: Patient is not psychotic, no suicidal or hemocidal ideation.  Labs on Admission:  Basic Metabolic Panel:  Recent Labs Lab 03/19/14 1740  NA 130*  K 5.5*  CL 93*  CO2 17*  GLUCOSE 250*  BUN 61*  CREATININE 3.21*  CALCIUM 9.3   Liver Function Tests:  Recent Labs Lab 03/19/14 1740  AST 17  ALT 16  ALKPHOS 73  BILITOT 0.5  PROT 7.5  ALBUMIN 3.6   No results found for this basename: LIPASE, AMYLASE,  in the last 168 hours No results found for this basename: AMMONIA,  in the last 168 hours CBC:  Recent Labs Lab 03/19/14 1740  WBC 19.1*  NEUTROABS 15.2*  HGB 11.1*  HCT 31.5*  MCV 84.9  PLT 214   Cardiac Enzymes: No results found for this basename: CKTOTAL, CKMB, CKMBINDEX, TROPONINI,  in the last 168 hours  BNP (last 3 results) No results found for this basename: PROBNP,  in the last 8760 hours CBG: No results found for this basename: GLUCAP,  in the last 168 hours  Radiological Exams on Admission: Dg Foot Complete Right  03/19/2014   CLINICAL DATA:  Foot pain for 5 years, history diabetes  EXAM: RIGHT FOOT COMPLETE - 3+ VIEW  COMPARISON:  09/01/2009  FINDINGS: Dressing artifacts at great toe.  Remaining joint spaces preserved.  No acute fracture or dislocation.  Soft tissue swelling great toe with multiple foci of soft tissue gas consistent with soft tissue infection by gas-forming organism.  While some of the observed soft tissue gas is in close  proximity to bones within the great toe, no definite bone destruction is seen to suggest osteomyelitis.  Old corticated erosion at the medial margin of the base of the proximal phalanx great toe question related to degenerative changes or an inflammatory arthropathy such as gout.  Diffuse osseous demineralization.  Advanced degenerative changes of first MTP joint.  Narrowing in destruction with bony debris at intertarsal joints compatible with a Charcot changes with midfoot collapse and pes planus.  Large plantar calcaneal spur.  IMPRESSION: Extensive soft tissue swelling in the great toe a region with multiple foci of soft tissue gas compatible with infection by a gas-forming organism.  No definite evidence of osteomyelitis though if this remains a clinical concern recommend MR imaging.  Advanced Charcot changes of the midfoot.   Electronically Signed  By: Ulyses Southward M.D.   On: 03/19/2014 18:13    Assessment/Plan Principal Problem:   Right foot infection Active Problems:   Sepsis   Hypertension   Type 2 diabetes mellitus   AKI (acute kidney injury)   1.  Right great toe infection and sepsis: The foot exam combined with the x-ray results suggest severe infection with gas in the soft tissues. Currently patient has sepsis given fever, tachycardia and leukocytosis. Lactic acid 1.2.  - Admit to regular floor  - d/c home blood pressure meds given high risk of developing septic shock - Empiric antimicrobial treatment with vancomycin and zosyn IV which were started in ED - IVF: received 1 L NS in Ed, will give 2 more liters NS bolus, then followed by 150 cc/h  - Blood cultures x 2  - Symptomatic treatment: prn Tylenol for fever, prn zofran for nausea - ED consulted orthopedic, need to follow up recommendations  2.  Type 2 diabetes: Last A1c was 9.0 on 09/04/09. On oral medications at home  -Will switch oral meds to insulin -Lantus 5 U daily -SSI - A1c  3. HTN: Blood pressure is a soft. -DC  all blood pressure medications as #1  4. AKI: Creatinine is up from 1.35 on 09/09/09-3.21 on admission. This is most likely due to sepsis - Aggressive IV fluid as above - follow up renal function by BMP - follow up UA and Culture   DVT ppx: SQ Heparin  Code Status: Full code Family Communication: None at bed side.  Disposition Plan: Admit to inpatient  Lorretta Harp [facility] Pager [phone]  If 7PM-7AM, please contact night-coverage [email or web address] Password [facility] 03/19/2014, 11:09 PM   Addendum:12:23   the patient's blood pressure has been dropping. We resuscitated patient with aggressive IV fluid, after received 4 L of normal saline, patient's systolic blood pressure is still lower than 90. Consulted to PCCM., agreed to take over the care. Patient will be admitted to critical care unit.

## 2014-03-19 NOTE — ED Notes (Signed)
Paged MD Nui. Patient's BP dropping. MD ordered 2,000 mL Bolus.

## 2014-03-19 NOTE — ED Provider Notes (Signed)
CSN: [id number]     Arrival date & time 03/19/14  1641 History   First MD Initiated Contact with Patient 03/19/14 2004     Chief Complaint  Patient presents with  . Foot Pain     (Consider location/radiation/quality/duration/timing/severity/associated sxs/prior Treatment) HPI Comments: The patient is a 53 year old male with past medical history of diabetes, Charcot foot, neuropathy, present emergency room chief complaint of right foot infection worsening over the past 4 days. The patient reports worsening of swelling, pain, redness, draining to great toe of right foot for 4 days.  He reports fever of 101 3 days ago. Patient also endorses nausea with multiple episodes of vomiting. Denies cough.  The patient's spouse initially reports removal of a callous in November and the lesion has not healed since. Reports non compliance with oral DM medication today. Reports home BS 130-150 recently. Denies history of previous MI or history of heart failure. PCP: Frederich Chick, MD Debor[facility] Sprang) Ortho: Ollen Gross, M.D.  The history is provided by the spouse and the patient. No language interpreter was used.    Past Medical History  Diagnosis Date  . Diabetes mellitus without complication    History reviewed. No pertinent past surgical history. History reviewed. No pertinent family history. History  Substance Use Topics  . Smoking status: Never Smoker   . Smokeless tobacco: Not on file  . Alcohol Use: No    Review of Systems  Constitutional: Positive for fever.  Gastrointestinal: Positive for nausea and vomiting. Negative for abdominal pain.  Musculoskeletal: Positive for joint swelling.  Skin: Positive for color change and wound.      Allergies  Review of patient's allergies indicates no known allergies.  Home Medications   Prior to Admission medications   Not on File   BP 103/47  Pulse 114  Temp(Src) 99.9 F (37.7 C) (Oral)  Resp 18  SpO2 99% Physical Exam  Nursing note and  vitals reviewed. Constitutional: He is oriented to person, place, and time. He appears well-developed and well-nourished. No distress.  HENT:  Head: Normocephalic and atraumatic.  Eyes: EOM are normal.  Neck: Thyromegaly present.  Cardiovascular: Regular rhythm.  Tachycardia present.   Pulses:      Radial pulses are 2+ on the right side, and 2+ on the left side.       Dorsalis pedis pulses are 2+ on the right side, and 2+ on the left side.  Abdominal: Soft. Normal appearance. There is no tenderness. There is no rigidity, no rebound and no guarding.  Musculoskeletal:  Right lower extremity: Severely swollen great toe with associated erythema, tender to palpation. Medial aspect of toe with masticated appearance and moderate amount of drainage noted. Sole of foot with fluctuant lesion to ball of foot.  Neurological: He is alert and oriented to person, place, and time.  Skin: Skin is warm and dry. He is not diaphoretic.    ED Course  Procedures (including critical care time) CRITICAL CARE Performed by: Clabe Seal   Total critical care time: 35  Critical care time was exclusive of separately billable procedures and treating other patients.  Critical care was necessary to treat or prevent imminent or life-threatening deterioration.  Critical care was time spent personally by me on the following activities: development of treatment plan with patient and/or surrogate as well as nursing, discussions with consultants, evaluation of patient's response to treatment, examination of patient, obtaining history from patient or surrogate, ordering and performing treatments and interventions, ordering and review of  laboratory studies, ordering and review of radiographic studies, pulse oximetry and re-evaluation of patient's condition.  Labs Review Labs Reviewed  CBC WITH DIFFERENTIAL - Abnormal; Notable for the following:    WBC 19.1 (*)    RBC 3.71 (*)    Hemoglobin 11.1 (*)    HCT 31.5 (*)     Neutrophils Relative % 79 (*)    Neutro Abs 15.2 (*)    Lymphocytes Relative 9 (*)    Monocytes Absolute 2.2 (*)    All other components within normal limits  COMPREHENSIVE METABOLIC PANEL - Abnormal; Notable for the following:    Sodium 130 (*)    Potassium 5.5 (*)    Chloride 93 (*)    CO2 17 (*)    Glucose, Bld 250 (*)    BUN 61 (*)    Creatinine, Ser 3.21 (*)    GFR calc non Af Amer 21 (*)    GFR calc Af Amer 24 (*)    Anion gap 20 (*)    All other components within normal limits  I-STAT CG4 LACTIC ACID, ED    Imaging Review Dg Foot Complete Right  03/19/2014   CLINICAL DATA:  Foot pain for 5 years, history diabetes  EXAM: RIGHT FOOT COMPLETE - 3+ VIEW  COMPARISON:  09/01/2009  FINDINGS: Dressing artifacts at great toe.  Remaining joint spaces preserved.  No acute fracture or dislocation.  Soft tissue swelling great toe with multiple foci of soft tissue gas consistent with soft tissue infection by gas-forming organism.  While some of the observed soft tissue gas is in close proximity to bones within the great toe, no definite bone destruction is seen to suggest osteomyelitis.  Old corticated erosion at the medial margin of the base of the proximal phalanx great toe question related to degenerative changes or an inflammatory arthropathy such as gout.  Diffuse osseous demineralization.  Advanced degenerative changes of first MTP joint.  Narrowing in destruction with bony debris at intertarsal joints compatible with a Charcot changes with midfoot collapse and pes planus.  Large plantar calcaneal spur.  IMPRESSION: Extensive soft tissue swelling in the great toe a region with multiple foci of soft tissue gas compatible with infection by a gas-forming organism.  No definite evidence of osteomyelitis though if this remains a clinical concern recommend MR imaging.  Advanced Charcot changes of the midfoot.   Electronically Signed   By: Ulyses Southward M.D.   On: 03/19/2014 18:13     EKG  Interpretation None      MDM   Final diagnoses:  Infection of toe  Acute kidney injury  Type 2 diabetes mellitus with ketoacidosis without coma   Patient presents with infection of right great toe with gas formation in soft tissue, red streaking up the foot, tachycardic, pressures low at 103/47, acute kidney injury creatinine 3.21, elevated from 1.35,  4 years ago. CMP shows anion gap of 20, urine shows ketones. Pt mentation appropriately. Blood cultures, 2 L boluses, broad-spectrum antibiotics ordered. Plan to admit to medicine and likely consult orthopedics. Dr. Donnald Garre also evaluated the patient during this encounter.  Dr. Fayrene Fearing to admit the patient. Dr. Lequita Halt decline to evaluate the patient in the ED. Discussed with Dr. Magnus Ivan who agrees to evaluate the patient in the ED.  Meds given in ED:  Medications  sodium chloride 0.9 % bolus 1,000 mL (not administered)  sodium chloride 0.9 % bolus 2,898 mL (1,000 mLs Intravenous New Bag/Given 03/19/14 2051)    Followed by  0.9 %  sodium chloride infusion (not administered)  vancomycin (VANCOCIN) 1,250 mg in sodium chloride 0.9 % 250 mL IVPB (1,250 mg Intravenous New Bag/Given 03/19/14 2133)  piperacillin-tazobactam (ZOSYN) IVPB 3.375 g (not administered)  piperacillin-tazobactam (ZOSYN) IVPB 3.375 g (0 g Intravenous Stopped 03/19/14 2133)  morphine 4 MG/ML injection 4 mg (4 mg Intravenous Given 03/19/14 2055)  ondansetron (ZOFRAN) injection 4 mg (4 mg Intravenous Given 03/19/14 2055)  acetaminophen (TYLENOL) tablet 1,000 mg (1,000 mg Oral Given 03/19/14 2123)    New Prescriptions   No medications on file     Mellody Drown, PA-C 03/20/14 0108

## 2014-03-19 NOTE — ED Notes (Signed)
Pt alert and oriented X 4- denies any complaints at this time.  Denies dizziness or lightheadedness.

## 2014-03-20 ENCOUNTER — Inpatient Hospital Stay (HOSPITAL_COMMUNITY): Payer: Managed Care, Other (non HMO)

## 2014-03-20 ENCOUNTER — Encounter (HOSPITAL_COMMUNITY): Payer: Self-pay | Admitting: Pulmonary Disease

## 2014-03-20 DIAGNOSIS — R6520 Severe sepsis without septic shock: Secondary | ICD-10-CM | POA: Diagnosis present

## 2014-03-20 DIAGNOSIS — R6521 Severe sepsis with septic shock: Secondary | ICD-10-CM

## 2014-03-20 DIAGNOSIS — A419 Sepsis, unspecified organism: Secondary | ICD-10-CM

## 2014-03-20 DIAGNOSIS — L089 Local infection of the skin and subcutaneous tissue, unspecified: Secondary | ICD-10-CM

## 2014-03-20 DIAGNOSIS — N179 Acute kidney failure, unspecified: Secondary | ICD-10-CM

## 2014-03-20 LAB — COMPREHENSIVE METABOLIC PANEL
ALT: 13 U/L (ref 0–53)
ANION GAP: 16 — AB (ref 5–15)
AST: 12 U/L (ref 0–37)
Albumin: 2.6 g/dL — ABNORMAL LOW (ref 3.5–5.2)
Alkaline Phosphatase: 46 U/L (ref 39–117)
BUN: 55 mg/dL — AB (ref 6–23)
CALCIUM: 7.6 mg/dL — AB (ref 8.4–10.5)
CO2: 15 meq/L — AB (ref 19–32)
CREATININE: 2.44 mg/dL — AB (ref 0.50–1.35)
Chloride: 101 mEq/L (ref 96–112)
GFR calc Af Amer: 33 mL/min — ABNORMAL LOW (ref >90)
GFR, EST NON AFRICAN AMERICAN: 29 mL/min — AB (ref >90)
GLUCOSE: 253 mg/dL — AB (ref 70–99)
Potassium: 4.9 mEq/L (ref 3.7–5.3)
SODIUM: 132 meq/L — AB (ref 137–147)
TOTAL PROTEIN: 5.9 g/dL — AB (ref 6.0–8.3)
Total Bilirubin: 0.3 mg/dL (ref 0.3–1.2)

## 2014-03-20 LAB — CBC
HEMATOCRIT: 26.3 % — AB (ref 39.0–52.0)
HEMATOCRIT: 27.4 % — AB (ref 39.0–52.0)
Hemoglobin: 9.1 g/dL — ABNORMAL LOW (ref 13.0–17.0)
Hemoglobin: 9.5 g/dL — ABNORMAL LOW (ref 13.0–17.0)
MCH: 29.8 pg (ref 26.0–34.0)
MCH: 29.8 pg (ref 26.0–34.0)
MCHC: 34.6 g/dL (ref 30.0–36.0)
MCHC: 34.7 g/dL (ref 30.0–36.0)
MCV: 86.2 fL (ref 78.0–100.0)
MCV: 85.9 fL (ref 78.0–100.0)
PLATELETS: 177 10*3/uL (ref 150–400)
Platelets: 191 10*3/uL (ref 150–400)
RBC: 3.05 MIL/uL — AB (ref 4.22–5.81)
RBC: 3.19 MIL/uL — ABNORMAL LOW (ref 4.22–5.81)
RDW: 14 % (ref 11.5–15.5)
RDW: 14 % (ref 11.5–15.5)
WBC: 14.9 10*3/uL — AB (ref 4.0–10.5)
WBC: 15.4 10*3/uL — AB (ref 4.0–10.5)

## 2014-03-20 LAB — APTT: aPTT: 34 seconds (ref 24–37)

## 2014-03-20 LAB — CREATININE, SERUM
Creatinine, Ser: 2.71 mg/dL — ABNORMAL HIGH (ref 0.50–1.35)
GFR calc Af Amer: 29 mL/min — ABNORMAL LOW (ref >90)
GFR calc non Af Amer: 25 mL/min — ABNORMAL LOW (ref >90)

## 2014-03-20 LAB — PROTIME-INR
INR: 1.26 (ref 0.00–1.49)
Prothrombin Time: 15.8 seconds — ABNORMAL HIGH (ref 11.6–15.2)

## 2014-03-20 LAB — GLUCOSE, CAPILLARY
GLUCOSE-CAPILLARY: 274 mg/dL — AB (ref 70–99)
GLUCOSE-CAPILLARY: 179 mg/dL — AB (ref 70–99)
Glucose-Capillary: 194 mg/dL — ABNORMAL HIGH (ref 70–99)
Glucose-Capillary: 286 mg/dL — ABNORMAL HIGH (ref 70–99)
Glucose-Capillary: 218 mg/dL — ABNORMAL HIGH (ref 70–99)

## 2014-03-20 LAB — LACTIC ACID, PLASMA: LACTIC ACID, VENOUS: 1.1 mmol/L (ref 0.5–2.2)

## 2014-03-20 LAB — HEMOGLOBIN A1C
Hgb A1c MFr Bld: 7.5 % — ABNORMAL HIGH (ref <5.7)
Mean Plasma Glucose: 169 mg/dL — ABNORMAL HIGH (ref <117)

## 2014-03-20 LAB — MRSA PCR SCREENING: MRSA by PCR: NEGATIVE

## 2014-03-20 LAB — CORTISOL: Cortisol, Plasma: 25.5 ug/dL

## 2014-03-20 MED ORDER — INSULIN ASPART 100 UNIT/ML ~~LOC~~ SOLN
3.0000 [IU] | Freq: Three times a day (TID) | SUBCUTANEOUS | Status: DC
  Administered 2014-03-20 – 2014-03-24 (×10): 3 [IU] via SUBCUTANEOUS

## 2014-03-20 MED ORDER — SODIUM CHLORIDE 0.9 % IV SOLN: 250.0000 mL | INTRAVENOUS | Status: DC | PRN

## 2014-03-20 MED ORDER — SODIUM CHLORIDE 0.9 % IV BOLUS (SEPSIS)
1000.0000 mL | Freq: Once | INTRAVENOUS | Status: AC
  Administered 2014-03-20: 1000 mL via INTRAVENOUS

## 2014-03-20 MED ORDER — HEPARIN SODIUM (PORCINE) 5000 UNIT/ML IJ SOLN
5000.0000 [IU] | Freq: Three times a day (TID) | INTRAMUSCULAR | Status: DC
  Administered 2014-03-20 – 2014-03-23 (×13): 5000 [IU] via SUBCUTANEOUS
  Filled 2014-03-20 (×18): qty 1

## 2014-03-20 MED ORDER — ONDANSETRON HCL 4 MG/2ML IJ SOLN
4.0000 mg | Freq: Three times a day (TID) | INTRAMUSCULAR | Status: DC
  Administered 2014-03-20 – 2014-03-23 (×5): 4 mg via INTRAVENOUS
  Filled 2014-03-20 (×7): qty 2

## 2014-03-20 MED ORDER — INSULIN GLARGINE 100 UNIT/ML ~~LOC~~ SOLN
5.0000 [IU] | Freq: Every day | SUBCUTANEOUS | Status: DC
  Administered 2014-03-20: 5 [IU] via SUBCUTANEOUS
  Filled 2014-03-20 (×2): qty 0.05

## 2014-03-20 MED ORDER — INSULIN ASPART 100 UNIT/ML ~~LOC~~ SOLN
0.0000 [IU] | Freq: Three times a day (TID) | SUBCUTANEOUS | Status: DC
  Administered 2014-03-20: 8 [IU] via SUBCUTANEOUS
  Administered 2014-03-21: 3 [IU] via SUBCUTANEOUS
  Administered 2014-03-21 – 2014-03-22 (×3): 2 [IU] via SUBCUTANEOUS
  Administered 2014-03-22 (×2): 3 [IU] via SUBCUTANEOUS
  Administered 2014-03-23: 2 [IU] via SUBCUTANEOUS
  Administered 2014-03-23: 3 [IU] via SUBCUTANEOUS
  Administered 2014-03-23: 5 [IU] via SUBCUTANEOUS
  Administered 2014-03-24: 3 [IU] via SUBCUTANEOUS

## 2014-03-20 MED ORDER — SODIUM CHLORIDE 0.9 % IV SOLN
INTRAVENOUS | Status: DC
  Administered 2014-03-20 – 2014-03-24 (×3): via INTRAVENOUS

## 2014-03-20 MED ORDER — SIMVASTATIN 40 MG PO TABS
40.0000 mg | ORAL_TABLET | Freq: Every day | ORAL | Status: DC
  Administered 2014-03-20 – 2014-03-23 (×4): 40 mg via ORAL
  Filled 2014-03-20 (×5): qty 1

## 2014-03-20 MED ORDER — MORPHINE SULFATE 2 MG/ML IJ SOLN
2.0000 mg | INTRAMUSCULAR | Status: DC | PRN
  Administered 2014-03-20: 2 mg via INTRAVENOUS
  Filled 2014-03-20: qty 1

## 2014-03-20 MED ORDER — SODIUM CHLORIDE 0.9 % IV SOLN: INTRAVENOUS | Status: DC

## 2014-03-20 MED ORDER — CETYLPYRIDINIUM CHLORIDE 0.05 % MT LIQD
7.0000 mL | Freq: Two times a day (BID) | OROMUCOSAL | Status: DC
  Administered 2014-03-20 – 2014-03-21 (×3): 7 mL via OROMUCOSAL

## 2014-03-20 MED ORDER — NOREPINEPHRINE BITARTRATE 1 MG/ML IV SOLN
2.0000 ug/min | INTRAVENOUS | Status: DC
  Administered 2014-03-20: 5 ug/min via INTRAVENOUS
  Filled 2014-03-20 (×2): qty 8

## 2014-03-20 MED ORDER — INSULIN ASPART 100 UNIT/ML ~~LOC~~ SOLN
0.0000 [IU] | Freq: Every day | SUBCUTANEOUS | Status: DC
  Administered 2014-03-23: 2 [IU] via SUBCUTANEOUS

## 2014-03-20 MED ORDER — ACETAMINOPHEN 325 MG PO TABS
650.0000 mg | ORAL_TABLET | Freq: Four times a day (QID) | ORAL | Status: DC | PRN
  Administered 2014-03-21 – 2014-03-24 (×3): 650 mg via ORAL
  Filled 2014-03-20 (×2): qty 2

## 2014-03-20 MED ORDER — SODIUM CHLORIDE 0.9 % IV BOLUS (SEPSIS): 1000.0000 mL | Freq: Once | INTRAVENOUS | Status: DC

## 2014-03-20 MED ORDER — ASPIRIN EC 81 MG PO TBEC
81.0000 mg | DELAYED_RELEASE_TABLET | Freq: Every morning | ORAL | Status: DC
  Administered 2014-03-20 – 2014-03-24 (×5): 81 mg via ORAL
  Filled 2014-03-20 (×5): qty 1

## 2014-03-20 MED ORDER — INSULIN ASPART 100 UNIT/ML ~~LOC~~ SOLN
0.0000 [IU] | Freq: Three times a day (TID) | SUBCUTANEOUS | Status: DC
  Administered 2014-03-20: 2 [IU] via SUBCUTANEOUS

## 2014-03-20 NOTE — Progress Notes (Signed)
Physical Therapy Wound Treatment and Evaluation Patient Details  Name: Calvin Macias MRN: [id number] Date of Birth: [birth year]  Today's Date: 03/20/2014 Time: 8182-9937 Time Calculation (min): 45 min  Subjective  Subjective: It was alot bigger than than before he openned it up. Patient and Family Stated Goals: Healed up including the ulcer on the bottom. Date of Onset:  (3 days PTA)  Pain Score:    Wound Assessment  Wound / Incision (Open or Dehisced) 03/19/14 Foot Right (Active)  Dressing Type Gauze (Comment) 03/20/2014 10:40 AM  Dressing Changed Changed 03/20/2014 10:40 AM  Dressing Status Clean;Dry;Intact 03/20/2014 10:40 AM  Dressing Change Frequency PRN 03/20/2014 10:40 AM  Site / Wound Assessment Red;Brown 03/20/2014 10:40 AM  % Wound base Red or Granulating 70% 03/20/2014 10:40 AM  % Wound base Yellow 0% 03/20/2014 10:40 AM  % Wound base Black 30% 03/20/2014 10:40 AM  Peri-wound Assessment Other (Comment);Erythema (blanchable) 03/20/2014 10:40 AM  Wound Length (cm) 1 cm 03/20/2014 10:40 AM  Wound Width (cm) 1 cm 03/20/2014 10:40 AM  Undermining (cm) 3 to 4 cm dorsal surface of great toe due to gas pockets 03/20/2014 10:40 AM  Margins Unattached edges (unapproximated) 03/20/2014 10:40 AM  Closure None 03/20/2014 10:40 AM  Drainage Amount Copious 03/20/2014 10:40 AM  Drainage Description Odor;Purulent;Sanguineous 03/20/2014 10:40 AM  Non-staged Wound Description Full thickness 03/20/2014 10:40 AM  Treatment Cleansed;Debridement (Selective);Hydrotherapy (Pulse lavage) 03/20/2014 10:40 AM   Hydrotherapy Pulsed lavage therapy - wound location: R great toe communicating wound (s) Pulsed Lavage with Suction (psi): 4 psi Pulsed Lavage with Suction - Normal Saline Used: 2000 mL Pulsed Lavage Tip: Other (comment) (both tunnel and shield tips) Selective Debridement Selective Debridement - Location: wound bed or 3 communicating wounds Selective Debridement - Tools Used: Forceps;Scissors Selective  Debridement - Tissue Removed: brown/black necrotic tissues   Wound Assessment and Plan  Wound Therapy - Assess/Plan/Recommendations Wound Therapy - Clinical Statement: Wound can benefit from the tunneling tip PLS and selective debridement of accessible tissues to promote healthy wound in which healthy tissue can grow Wound Therapy - Functional Problem List: Will hinder ambulation at this point. Factors Delaying/Impairing Wound Healing: Diabetes Mellitus;Infection - systemic/local Hydrotherapy Plan: Debridement;Dressing change;Pulsatile lavage with suction;Patient/family education Wound Therapy - Frequency: Other (comment) (planned for 2 days) Wound Therapy - Current Recommendations: Other (comment) [facility]) Wound Therapy - Follow Up Recommendations: Home health RN Wound Plan: 2 days of PLS and debridement unless MD orders further treatment  Wound Therapy Goals- Improve the function of patient's integumentary system by progressing the wound(s) through the phases of wound healing (inflammation - proliferation - remodeling) by: Decrease Necrotic Tissue to: 20% visible Decrease Necrotic Tissue - Progress: Goal set today Increase Granulation Tissue to: 75% visible Increase Granulation Tissue - Progress: Goal set today Improve Drainage Characteristics: Min;Serous Improve Drainage Characteristics - Progress: Goal set today Goals/treatment plan/discharge plan were made with and agreed upon by patient/family: Yes Time For Goal Achievement: 7 days Wound Therapy - Potential for Goals: Good  Goals will be updated until maximal potential achieved or discharge criteria met.  Discharge criteria: when goals achieved, discharge from hospital, MD decision/surgical intervention, no progress towards goals, refusal/missing three consecutive treatments without notification or medical reason.  GP     Uriah Trueba, Tessie Fass 03/20/2014, 11:01 AM 03/20/2014  Donnella Sham, [place] [phone]   (pager)

## 2014-03-20 NOTE — Progress Notes (Signed)
eLink Physician-Brief Progress Note Patient Name: Calvin Macias DOB: [birth year] MRN: [id number]   Date of Service  03/20/2014  HPI/Events of Note  Consult received from [facility] hospitalists for severe sepsis/septic shock in diabetic male with toe infection.  S/P I&D in ED by surgery, still hypotensive to 70/40 range despite 4L NS.   eICU Interventions  Will change admission orders to ICU, bed control notified Continue Vanc/zosyn Check lactic acid again, cortisol On ground team to evaluate     Intervention Category Evaluation Type: New Patient Evaluation  Calvin Macias, Calvin Macias 03/20/2014, 12:36 AM

## 2014-03-20 NOTE — ED Notes (Addendum)
Spoke with Dr. Clyde Lundborg about pt remaining hypotensive despite fluids- states he will contact critical care.  Pt remains asymptomatic.

## 2014-03-20 NOTE — Consult Note (Signed)
PULMONARY / CRITICAL CARE MEDICINE   Name: Calvin Macias MRN: [id number] DOB: 1961-01-12    ADMISSION DATE:  03/19/2014 CONSULTATION DATE:  03/20/2014  REFERRING MD :  Clyde Lundborg  CHIEF COMPLAINT:  Right toe pain and swelling  INITIAL PRESENTATION: 53 y.o. M brought to ED on 9/9 with right toe pain and swelling x 4 days.  In ED,  Xray revealed multiple foci of gas.  Orthopedics was consulted and they performed bedside I&D.  Pt remained hypotensive despite 4L IVF's.  PCCM was consulted for severe sepsis.  STUDIES:  Right foot xray 9/9 >>> extensive soft tissue swelling in the great toe with multiple foci of soft tissue gas.  No definite evidence of osteomyelitis.  SIGNIFICANT EVENTS: 9/9 - presented to ED, had beside I&D by ortho for right great toe infection, remained hypotensive so admitted to ICU.   HISTORY OF PRESENT ILLNESS:   Calvin Macias is a 53 y.o. M with PMH of DM, Charcot foot, neuropathy, HTN.  He presented to the [facility] ED on 9/9 with right toe pain and swelling with some drainage.  He stateas that he had a callous on the foot almost 1 year ago and went to a podiatrist at the time.  Podiatrist did some treatments that caused bleeding.  Since then he has been using an antibiotic cream but has continued to have problems with the callous healing.  Over the past 4 days, he states pain and swelling have increased and the toe has become red and was draining some fluid.  He saw his PCP today and during the visit was hypotensive.  PCP advised that he be evaluated in the ED.  He denies having any recent fevers/chills/sweats.  Has had some nausea. In ED, Xray of the foot revealed soft tissue swelling in great toe with multiple foci of soft tissue gas. Orthopedics was consulted (Dr. Magnus Ivan), and they performed a bedside I&D.  Following the I&D, pt remained hypotensive despite 4L IVF.  PCCM was consulted for admission of severe sepsis.   PAST MEDICAL HISTORY :  Past Medical History  Diagnosis Date  .  Diabetes mellitus without complication    History reviewed. No pertinent past surgical history. Prior to Admission medications   Medication Sig Start Date End Date Taking? Authorizing Provider  aspirin 81 MG tablet Take 81 mg by mouth every morning.   Yes Historical Provider, MD  benazepril (LOTENSIN) 20 MG tablet Take 20 mg by mouth daily. 12/16/13  Yes Historical Provider, MD  glipiZIDE (GLUCOTROL) 10 MG tablet Take 10 tablets by mouth 2 (two) times daily. 12/16/13  Yes Historical Provider, MD  hydrochlorothiazide (MICROZIDE) 12.5 MG capsule Take 12.5 mg by mouth daily. 12/16/13  Yes Historical Provider, MD  INVOKANA 100 MG TABS Take 100 mg by mouth daily. 01/22/14  Yes Historical Provider, MD  metFORMIN (GLUCOPHAGE) 1000 MG tablet Take 1,000 mg by mouth 2 (two) times daily. 12/16/13  Yes Historical Provider, MD  Multiple Vitamins-Minerals (MENS LIFE PACK) TABS Take 1 each by mouth daily.   Yes Historical Provider, MD  pioglitazone (ACTOS) 45 MG tablet Take 45 mg by mouth daily. 12/16/13  Yes Historical Provider, MD  simvastatin (ZOCOR) 40 MG tablet Take 40 mg by mouth daily. 12/16/13  Yes Historical Provider, MD  VICTOZA 18 MG/3ML SOPN Inject 1.8 mg into the skin daily. 12/23/13  Yes Historical Provider, MD   No Known Allergies  FAMILY HISTORY:  Family History  Problem Relation Age of Onset  . Hyperlipidemia Mother   .  Diabetes Father    SOCIAL HISTORY:  reports that he has never smoked. He does not have any smokeless tobacco history on file. He reports that he does not drink alcohol or use illicit drugs.  REVIEW OF SYSTEMS:   All negative; except for those that are bolded, which indicate positives.  Constitutional: weight loss, weight gain, night sweats, fevers, chills, fatigue, weakness.  HEENT: headaches, sore throat, sneezing, nasal congestion, post nasal drip, difficulty swallowing, tooth/dental problems, visual complaints, visual changes, ear aches. Neuro: difficulty with speech, weakness,  numbness, ataxia. CV:  chest pain, orthopnea, PND, swelling in lower extremities, dizziness, palpitations, syncope.  Resp: cough, hemoptysis, dyspnea, wheezing. GI  heartburn, indigestion, abdominal pain, nausea, vomiting, diarrhea, constipation, change in bowel habits, loss of appetite, hematemesis, melena, hematochezia.  GU: dysuria, change in color of urine, urgency or frequency, flank pain, hematuria. MSK: joint pain or swelling, decreased range of motion. Psych: change in mood or affect, depression, anxiety, suicidal ideations, homicidal ideations. Skin: rash, itching, bruising.   SUBJECTIVE:   VITAL SIGNS: Temp:  [99.9 F (37.7 C)-100.1 F (37.8 C)] 100.1 F (37.8 C) (09/09 2058) Pulse Rate:  [85-114] 86 (09/10 0015) Resp:  [13-20] 15 (09/10 0015) BP: (74-103)/(35-58) 75/35 mmHg (09/10 0015) SpO2:  [95 %-99 %] 96 % (09/10 0015) Weight:  [96.616 kg (213 lb)] 96.616 kg (213 lb) (09/09 2335) HEMODYNAMICS:   VENTILATOR SETTINGS:   INTAKE / OUTPUT: Intake/Output     09/09 0701 - 09/10 0700   P.O. 2000   I.V. (mL/kg) 2100 (21.7)   Total Intake(mL/kg) 4100 (42.4)   Net +4100        PHYSICAL EXAMINATION: General: Obese male, in NAD. Neuro: A&O x 3, non-focal.  HEENT: [place]/AT. PERRL, sclerae anicteric. Cardiovascular: RRR, no M/R/G.  Lungs: Respirations even and unlabored.  CTA bilaterally, No W/R/R.  Abdomen: BS x 4, soft, NT/ND.  Musculoskeletal: Right foot dressing in place, C/D/I.  Tender to palpation.  No crepitus or streaking up right leg. Skin: Intact, warm, no rashes.  LABS:  CBC  Recent Labs Lab 03/19/14 1740  WBC 19.1*  HGB 11.1*  HCT 31.5*  PLT 214   Coag's No results found for this basename: APTT, INR,  in the last 168 hours BMET  Recent Labs Lab 03/19/14 1740  NA 130*  K 5.5*  CL 93*  CO2 17*  BUN 61*  CREATININE 3.21*  GLUCOSE 250*   Electrolytes  Recent Labs Lab 03/19/14 1740  CALCIUM 9.3   Sepsis Markers  Recent Labs Lab  03/19/14 1752  LATICACIDVEN 1.20   ABG No results found for this basename: PHART, PCO2ART, PO2ART,  in the last 168 hours Liver Enzymes  Recent Labs Lab 03/19/14 1740  AST 17  ALT 16  ALKPHOS 73  BILITOT 0.5  ALBUMIN 3.6   Cardiac Enzymes No results found for this basename: TROPONINI, PROBNP,  in the last 168 hours Glucose No results found for this basename: GLUCAP,  in the last 168 hours  Imaging Dg Foot Complete Right  03/19/2014   CLINICAL DATA:  Foot pain for 5 years, history diabetes  EXAM: RIGHT FOOT COMPLETE - 3+ VIEW  COMPARISON:  09/01/2009  FINDINGS: Dressing artifacts at great toe.  Remaining joint spaces preserved.  No acute fracture or dislocation.  Soft tissue swelling great toe with multiple foci of soft tissue gas consistent with soft tissue infection by gas-forming organism.  While some of the observed soft tissue gas is in close proximity to bones within the  great toe, no definite bone destruction is seen to suggest osteomyelitis.  Old corticated erosion at the medial margin of the base of the proximal phalanx great toe question related to degenerative changes or an inflammatory arthropathy such as gout.  Diffuse osseous demineralization.  Advanced degenerative changes of first MTP joint.  Narrowing in destruction with bony debris at intertarsal joints compatible with a Charcot changes with midfoot collapse and pes planus.  Large plantar calcaneal spur.  IMPRESSION: Extensive soft tissue swelling in the great toe a region with multiple foci of soft tissue gas compatible with infection by a gas-forming organism.  No definite evidence of osteomyelitis though if this remains a clinical concern recommend MR imaging.  Advanced Charcot changes of the midfoot.   Electronically Signed   By: Ulyses Southward M.D.   On: 03/19/2014 18:13     ASSESSMENT / PLAN:  PULMONARY A: No acute issues P:   No intervention required.  CARDIOVASCULAR L IJ CVL 9/10 >>> A:  Septic shock Hx  HTN Hx HLD P:  Goal MAP > 65. Levophed gtt. Repeat lactate. Check cortisol. Continue outpatient zocor. Hold outpatient benazepril, hctz.  RENAL A:   AKI - likely prerenal from hypovolemia Mild AG metabolic acidosis P:   NS @ 150. BMP in AM.  GASTROINTESTINAL A:   Nutrition P:   Clear liquid diet in case needs to go to OR 9/10.  HEMATOLOGIC A:   VTE Prophylaxis P:  SCD's / Heparin CBC in AM.  INFECTIOUS A:   Septic shock - in setting of right toe/foot infection P:   BCx2 9/9 UCx 9/9 Abx: Vanc, start date 9/9, day 1/x. Abx: Zosyn, start date 9/9, day 1/x. Ortho following. PT consult for hydrotherapy in AM. May need trip to OR.  ENDOCRINE A:   DM with neuropathy P:   CBG's q4hr. SSI. Check cortisol. Hold outpatient glipizide, invokana, metformin, pioglitazone, victoza.  NEUROLOGIC A:   DM neuropathy P:   Pain control  TODAY'S SUMMARY: 53 y.o. M admitted for severe sepsis and possible early septic shock in setting of right great toe infection.  Had I&D done by ortho in ED.  Started on empiric abx.  Remained hypotensive therefore being admitted to ICU, starting levophed.   Rutherford Guys, [place] [facility] [place] [facility] Pgr: [phone]  or [phone] 03/20/2014, 12:37 AM  Reviewed above, examined.  53 yo male with DM and neuropathy presents with Rt foot abscess with septic shock.  Will admit to ICU under PCCM.  Continue Abx.  F/u with ortho for management of abscess.  Updated family at bedside.  CC time 50 minutes.  Coralyn Helling, MD [facility] Pulmonary/Critical Care 03/20/2014, 6:54 AM Pager:  [phone] After 3pm call: [phone]

## 2014-03-20 NOTE — Progress Notes (Signed)
Patient ID: Calvin Macias, male   DOB: [birth year], 53 y.o.   MRN: [id number] I was able to open wounds around his right great toe last evening in the ED and irrigate the wounds extensively with normal saline.  I did find extensive gross purulence in the soft tissues.  He is on IV antibiotics.  Will have the PT wound service perform hydrotherapy on his right foot today and tomorrow.  He will need to be continued on IV antibiotics until his WBC trends down further. Will follow closely.

## 2014-03-20 NOTE — Progress Notes (Signed)
Patient ID: Calvin Macias, male   DOB: 10-Sep-1960, 53 y.o.   MRN: [id number] Hydrotherapy performed on his right foot today.  I just took the dressing down and re-assessed his wound.  I looks much better than it did last evening.  I probed the wound and expressed no gross purulence.  I then packed it with a new wet-to-dry dressing.  His x-rays do not seem to show any bone involvement.  He will get hydrotherapy again tomorrow (9/11), which is essentially the same thing he would get in the OR.  Right now, I do not see a reason for an acute great toe amputation given his x-rays.  However, if he does not improve clinically, he may need more aggressive surgery such as an amputation.  Will continue to follow closely.

## 2014-03-20 NOTE — ED Notes (Signed)
Attempted report X1

## 2014-03-20 NOTE — Progress Notes (Signed)
PULMONARY / CRITICAL CARE MEDICINE   Name: Calvin Macias MRN: [id number] DOB: 1961-05-24    ADMISSION DATE:  03/19/2014 CONSULTATION DATE:  03/20/2014  REFERRING MD :  Clyde Lundborg  CHIEF COMPLAINT:  Right toe pain and swelling  INITIAL PRESENTATION: 53 y.o. M brought to ED on 9/9 with right toe pain and swelling x 4 days.  In ED,  Xray revealed multiple foci of gas.  Orthopedics was consulted and they performed bedside I&D.  Pt remained hypotensive despite 4L IVF's.  PCCM was consulted for severe sepsis.  SIGNIFICANT EVENTS/STUDIES:  9/09 R foot xray:  extensive soft tissue swelling in the great toe with multiple foci of soft tissue gas.  No definite evidence of osteomyelitis. 9/09 Ortho Consult: R foot wound opened and extensively irrigated 9/10 Cognition intact, BP stabilized, weaning vasopressors (NE), Uo excellent, Cr improving, pain well controlled. Undergoing scheduled hydrotherapy. Cont abx  LINES/TUBES: L IJ CVL 9/10 >>   MICRO: Urine 9/09 >>  Blood 9/09 >>   ABX: Vanc 9/09 >>  Zosyn 9/09 >>     SUBJECTIVE:  No new complaints. No distress  VITAL SIGNS: Temp:  [97.5 F (36.4 C)-100.1 F (37.8 C)] 98.5 F (36.9 C) (09/10 1245) Pulse Rate:  [82-114] 99 (09/10 1300) Resp:  [0-26] 13 (09/10 1300) BP: (71-127)/(35-94) 107/94 mmHg (09/10 1245) SpO2:  [86 %-100 %] 100 % (09/10 1300) Weight:  [96.616 kg (213 lb)-99.9 kg (220 lb 3.8 oz)] 99.9 kg (220 lb 3.8 oz) (09/10 0139) HEMODYNAMICS:   VENTILATOR SETTINGS:   INTAKE / OUTPUT: Intake/Output     09/09 0701 - 09/10 0700 09/10 0701 - 09/11 0700   P.O. 2000    I.V. (mL/kg) 3054.5 (30.6) 922.8 (9.2)   IV Piggyback 50    Total Intake(mL/kg) 5104.5 (51.1) 922.8 (9.2)   Urine (mL/kg/hr) 1050 1375 (1.9)   Total Output 1050 1375   Net +4054.5 -452.2         PHYSICAL EXAMINATION: General: NAD Neuro: A&O x 3, non-focal.  HEENT: normal Cardiovascular: RRR, no M.  Lungs: clear Abdomen: BS x 4, soft, NT/ND.  Ext: Right foot  dressing in place, C/D/I.    LABS: I have reviewed all of today's lab results. Relevant abnormalities are discussed in the A/P section  CXR: NACPD  ASSESSMENT / PLAN: INFECTIOUS A:   Severe sepsis  Diabetic R foot infection with abscess P:   Cont abx as above Ortho following Cont hydrotherapy  CARDIOVASCULAR A:  Septic shock Hx HTN Hx HLD P:  Wean NE to off for MAP > 60 mmHg  RENAL A:   AKI, nonoliguric - improving  AG metabolic acidosis P:   Monitor BMET intermittently Monitor I/Os Correct electrolytes as indicated NS decreased to 50 cc/hr  PULMONARY A: No acute issues P:   No intervention required.  ENDOCRINE A:   DM with neuropathy P:   Change SSI to ACHS  GASTROINTESTINAL A:   Nutrition P:   CHO mod diet SUP N/I  HEMATOLOGIC A:   No issues P:  DVT px: SQ heparin Monitor CBC intermittently Transfuse per usual ICU guidelines  NEUROLOGIC A:   DM neuropathy Pain P:   PRN fentanyl  TODAY'S SUMMARY:    Billy Fischer, MD ; [facility] service Mobile [phone].  After 5:30 PM or weekends, call [phone]

## 2014-03-20 NOTE — Progress Notes (Signed)
UR Completed.  Tyshan Enderle Jane [phone] 03/20/2014  

## 2014-03-20 NOTE — Procedures (Signed)
Central Venous Catheter Insertion Procedure Note HARLESS MOLINARI [id number] [birth year]  Procedure: Insertion of Central Venous Catheter Indications: Assessment of intravascular volume, Drug and/or fluid administration and Frequent blood sampling  Procedure Details Consent: Risks of procedure as well as the alternatives and risks of each were explained to the (patient/caregiver).  Consent for procedure obtained. Time Out: Verified patient identification, verified procedure, site/side was marked, verified correct patient position, special equipment/implants available, medications/allergies/relevent history reviewed, required imaging and test results available.  Performed  Maximum sterile technique was used including antiseptics, cap, gloves, gown, hand hygiene, mask and sheet. Skin prep: Chlorhexidine; local anesthetic administered A antimicrobial bonded/coated triple lumen catheter was placed in the left internal jugular vein using the Seldinger technique.  Evaluation Blood flow good Complications: No apparent complications Patient did tolerate procedure well. Chest X-ray ordered to verify placement.  CXR: pending.  Procedure performed under direct ultrasound guidance for real time vessel cannulation.      Rutherford Guys, PA - [facility] Pgr: [phone]  or [phone]

## 2014-03-20 NOTE — ED Notes (Signed)
Admitting physicians at bedside.

## 2014-03-21 ENCOUNTER — Inpatient Hospital Stay (HOSPITAL_COMMUNITY): Payer: Managed Care, Other (non HMO)

## 2014-03-21 DIAGNOSIS — R6520 Severe sepsis without septic shock: Secondary | ICD-10-CM

## 2014-03-21 DIAGNOSIS — R6521 Severe sepsis with septic shock: Secondary | ICD-10-CM

## 2014-03-21 DIAGNOSIS — N179 Acute kidney failure, unspecified: Secondary | ICD-10-CM

## 2014-03-21 DIAGNOSIS — A419 Sepsis, unspecified organism: Secondary | ICD-10-CM

## 2014-03-21 LAB — URINE CULTURE
COLONY COUNT: NO GROWTH
Culture: NO GROWTH

## 2014-03-21 LAB — GLUCOSE, CAPILLARY
GLUCOSE-CAPILLARY: 136 mg/dL — AB (ref 70–99)
Glucose-Capillary: 116 mg/dL — ABNORMAL HIGH (ref 70–99)
Glucose-Capillary: 144 mg/dL — ABNORMAL HIGH (ref 70–99)
Glucose-Capillary: 156 mg/dL — ABNORMAL HIGH (ref 70–99)
Glucose-Capillary: 170 mg/dL — ABNORMAL HIGH (ref 70–99)

## 2014-03-21 MED ORDER — INSULIN GLARGINE 100 UNIT/ML ~~LOC~~ SOLN
10.0000 [IU] | Freq: Every day | SUBCUTANEOUS | Status: DC
  Administered 2014-03-21 – 2014-03-24 (×4): 10 [IU] via SUBCUTANEOUS
  Filled 2014-03-21 (×5): qty 0.1

## 2014-03-21 NOTE — Progress Notes (Signed)
PULMONARY / CRITICAL CARE MEDICINE   Name: Calvin Macias MRN: [id number] DOB: 12-15-1960    ADMISSION DATE:  03/19/2014 CONSULTATION DATE:  03/21/2014  REFERRING MD :  Clyde Lundborg  CHIEF COMPLAINT:  Right toe pain and swelling  INITIAL PRESENTATION: 53 y.o. M brought to ED on 9/9 with right toe pain and swelling x 4 days.  In ED,  Xray revealed multiple foci of gas.  Orthopedics was consulted and they performed bedside I&D.  Pt remained hypotensive despite 4L IVF's.  PCCM was consulted for severe sepsis.  SIGNIFICANT EVENTS/STUDIES:  9/09 R foot xray:  extensive soft tissue swelling in the great toe with multiple foci of soft tissue gas.  No definite evidence of osteomyelitis. 9/09 Ortho Consult: R foot wound opened and extensively irrigated 9/10 Cognition intact, BP stabilized, weaning vasopressors (NE), Uo excellent, Cr improving, pain well controlled. Undergoing scheduled hydrotherapy. Cont abx 9/11 Transfer to med-surg. TRH to resume primary duties 9/12 and PCCM to sign off  LINES/TUBES: L IJ CVL 9/10 >> 9/11  MICRO: Urine 9/09 >> NEG Blood 9/09 >>   ABX: Vanc 9/09 >> 9/10 Zosyn 9/09 >>     SUBJECTIVE:  No new complaints. No distress. Cognition intact  VITAL SIGNS: Temp:  [98.1 F (36.7 C)-100 F (37.8 C)] 98.8 F (37.1 C) (09/11 1240) Pulse Rate:  [71-105] 88 (09/11 1300) Resp:  [0-32] 26 (09/11 1300) BP: (80-132)/(36-72) 127/57 mmHg (09/11 1300) SpO2:  [89 %-100 %] 96 % (09/11 1300) HEMODYNAMICS:   VENTILATOR SETTINGS:   INTAKE / OUTPUT: Intake/Output     09/10 0701 - 09/11 0700 09/11 0701 - 09/12 0700   P.O.     I.V. (mL/kg) 1862.6 (18.6) 300 (3)   IV Piggyback 650 50   Total Intake(mL/kg) 2512.6 (25.2) 350 (3.5)   Urine (mL/kg/hr) 2475 (1) 700 (1)   Total Output 2475 700   Net +37.6 -350        Urine Occurrence 825 x     PHYSICAL EXAMINATION: General: NAD Neuro: A&O x 3, non-focal.  HEENT: normal Cardiovascular: RRR, no M.  Lungs: clear Abdomen: BS x  4, soft, NT/ND.  Ext: Right foot dressing in place, C/D/I.    LABS: I have reviewed all of today's lab results. Relevant abnormalities are discussed in the A/P section  CXR: NANF  ASSESSMENT / PLAN: INFECTIOUS A:   Severe sepsis  Diabetic R foot infection with abscess P:   Cont abx as above Ortho following MRI ordered to r/o undrained abscess or osteo Cont hydrotherapy  CARDIOVASCULAR A:  Septic shock, resolved Hx HTN Hx HLD P:  Monitor BP Holding HCTZ and ACEI  RENAL A:   AKI, nonoliguric - improving AG metabolic acidosis P:   Monitor BMET intermittently Monitor I/Os Correct electrolytes as indicated Cont NS @ 50 cc/hr  PULMONARY A: No acute issues P:   No intervention required.  ENDOCRINE A:   DM 2 P:   Change SSI to ACHS Holding metformin and glipizide  GASTROINTESTINAL A:   No issues P:   CHO mod diet SUP N/I  HEMATOLOGIC A:   No issues P:  DVT px: SQ heparin Monitor CBC intermittently Transfuse per usual ICU guidelines  NEUROLOGIC A:   DM neuropathy Foot pain P:   PRN fentanyl  TODAY'S SUMMARY:  Transfer to med surg. TRH to resume AM 9/12 and PCCM to sign off  Billy Fischer, MD ; [facility] [phone].  After 5:30 PM or weekends, call [phone]

## 2014-03-21 NOTE — Progress Notes (Signed)
Patient ID: Calvin Macias, male   DOB: 01-Jun-1961, 53 y.o.   MRN: [id number] Will have hydrotherapy performed again today.  His WBC is still up.  Will order MRI of right foot to assess for any further pockets of abscess or osteo.

## 2014-03-21 NOTE — Progress Notes (Signed)
Physical Therapy Wound Treatment and Discharge Patient Details  Name: Calvin Macias MRN: [id number] Date of Birth: 11-02-1960  Today's Date: 03/21/2014 Time: 3664-4034 Time Calculation (min): 32 min  Subjective  Subjective: It feels better today. Reports wound began last November when visited a podiatrist who trimmed a callous on his foot. Patient and Family Stated Goals: Healed up including the ulcer on the bottom. Date of Onset:  (Reports an open wound since 05/2013.) Prior Treatments: dressing changes  Pain Score: "a little sore"; did not require pre-medication for pain, no grimacing during treatment  Wound Assessment  Clinical Statement: Due to purulent drainage on dressing, feel wound could continue to benefit from hydrotherapy with tunnel tip (vs surgical debridement-noted MRI pending). Will need new order for hydrotherapy, if desired. (Only ordered for 9/10 and 9/11)  Wound / Incision (Open or Dehisced) 03/19/14 Foot Right (Active)  Dressing Type Gauze (Comment) 03/21/2014  9:30 AM  Dressing Changed Changed 03/21/2014  9:30 AM  Dressing Status Clean;Dry;Intact 03/21/2014  9:30 AM  Dressing Change Frequency PRN 03/21/2014  9:30 AM  Site / Wound Assessment Pink;Yellow 03/21/2014  9:30 AM  % Wound base Red or Granulating 70% 03/21/2014  9:30 AM  % Wound base Yellow 30% 03/21/2014  9:30 AM  % Wound base Black 0% 03/21/2014  9:30 AM  Peri-wound Assessment Erythema (blanchable);Edema;Denuded 03/21/2014  9:30 AM  Wound Length (cm) 3 cm 03/21/2014  9:30 AM  Wound Width (cm) 1 cm 03/20/2014 10:40 AM  Undermining (cm) 3 to 4 cm dorsal surface of great toe due to gas pockets 03/20/2014 10:40 AM  Margins Unattached edges (unapproximated) 03/21/2014  9:30 AM  Closure None 03/21/2014  9:30 AM  Drainage Amount Copious 03/21/2014  9:30 AM  Drainage Description Odor;Purulent 03/21/2014  9:30 AM  Non-staged Wound Description Full thickness 03/21/2014  9:30 AM  Treatment Debridement (Selective);Hydrotherapy  (Pulse lavage);Packing (Saline gauze) 03/21/2014  9:30 AM   Hydrotherapy Pulsed lavage therapy - wound location: R great toe communicating wound (s) Pulsed Lavage with Suction (psi): 4 psi (to 8) Pulsed Lavage with Suction - Normal Saline Used: 1000 mL Pulsed Lavage Tip: Tunneling tip Selective Debridement Selective Debridement - Location: dorsal area of wound Selective Debridement - Tools Used: Forceps;Scissors Selective Debridement - Tissue Removed: yellow necrotic tissue   Wound Assessment and Plan  Wound Therapy - Assess/Plan/Recommendations Wound Therapy - Clinical Statement: Due to purulent drainage on dressing, feel wound could continue to benefit from hydrotherapy with tunnel tip (vs surgical debridement-noted MRI pending). Will need new order for hydrotherapy, if desired.  Wound Therapy - Functional Problem List: Will hinder ambulation at this point. Factors Delaying/Impairing Wound Healing: Diabetes Mellitus;Infection - systemic/local Hydrotherapy Plan: Debridement;Dressing change;Pulsatile lavage with suction;Patient/family education Wound Therapy - Frequency:  (order for 9/10 and 9/11 only) Wound Therapy - Current Recommendations: Surgery consult (following pt) Wound Therapy - Follow Up Recommendations: Home health RN Wound Plan: Hydrotherapy discontinued after today unless MD reorders  Wound Therapy Goals- Improve the function of patient's integumentary system by progressing the wound(s) through the phases of wound healing (inflammation - proliferation - remodeling) by: Decrease Necrotic Tissue to: 20% visible Decrease Necrotic Tissue - Progress: Not met Increase Granulation Tissue to: 75% visible Increase Granulation Tissue - Progress: Not met Improve Drainage Characteristics: Min;Serous Improve Drainage Characteristics - Progress: Not met Goals/treatment plan/discharge plan were made with and agreed upon by patient/family: Yes  Goals will be updated until maximal  potential achieved or discharge criteria met.  Discharge criteria: when goals achieved,  discharge from hospital, MD decision/surgical intervention, no progress towards goals, refusal/missing three consecutive treatments without notification or medical reason.  GP    Patient is being discharged from Hydrotherapy services secondary to: MD decision (ordered for 2 days only)   Please see above Progress Note for current wound status and progress toward goals.  Progress and discharge plan and discussed with patient/caregiver and they agree       Calvin Macias 03/21/2014, 9:54 AM Pager [phone]

## 2014-03-22 DIAGNOSIS — E1169 Type 2 diabetes mellitus with other specified complication: Secondary | ICD-10-CM

## 2014-03-22 DIAGNOSIS — L97509 Non-pressure chronic ulcer of other part of unspecified foot with unspecified severity: Secondary | ICD-10-CM

## 2014-03-22 LAB — CBC
HEMATOCRIT: 27.8 % — AB (ref 39.0–52.0)
HEMOGLOBIN: 9.7 g/dL — AB (ref 13.0–17.0)
MCH: 29.8 pg (ref 26.0–34.0)
MCHC: 34.9 g/dL (ref 30.0–36.0)
MCV: 85.5 fL (ref 78.0–100.0)
Platelets: 248 10*3/uL (ref 150–400)
RBC: 3.25 MIL/uL — ABNORMAL LOW (ref 4.22–5.81)
RDW: 14.3 % (ref 11.5–15.5)
WBC: 13.4 10*3/uL — ABNORMAL HIGH (ref 4.0–10.5)

## 2014-03-22 LAB — BASIC METABOLIC PANEL
Anion gap: 18 — ABNORMAL HIGH (ref 5–15)
BUN: 24 mg/dL — ABNORMAL HIGH (ref 6–23)
CALCIUM: 8.9 mg/dL (ref 8.4–10.5)
CO2: 16 mEq/L — ABNORMAL LOW (ref 19–32)
Chloride: 106 mEq/L (ref 96–112)
Creatinine, Ser: 1.4 mg/dL — ABNORMAL HIGH (ref 0.50–1.35)
GFR calc Af Amer: 65 mL/min — ABNORMAL LOW (ref >90)
GFR, EST NON AFRICAN AMERICAN: 56 mL/min — AB (ref >90)
Glucose, Bld: 111 mg/dL — ABNORMAL HIGH (ref 70–99)
Potassium: 4.9 mEq/L (ref 3.7–5.3)
Sodium: 140 mEq/L (ref 137–147)

## 2014-03-22 LAB — GLUCOSE, CAPILLARY
Glucose-Capillary: 131 mg/dL — ABNORMAL HIGH (ref 70–99)
Glucose-Capillary: 175 mg/dL — ABNORMAL HIGH (ref 70–99)
Glucose-Capillary: 159 mg/dL — ABNORMAL HIGH (ref 70–99)
Glucose-Capillary: 157 mg/dL — ABNORMAL HIGH (ref 70–99)

## 2014-03-22 MED ORDER — VANCOMYCIN HCL 10 G IV SOLR
1250.0000 mg | Freq: Two times a day (BID) | INTRAVENOUS | Status: DC
  Administered 2014-03-22 – 2014-03-23 (×4): 1250 mg via INTRAVENOUS
  Filled 2014-03-22 (×5): qty 1250

## 2014-03-22 NOTE — Progress Notes (Signed)
Patient ID: Calvin Macias, male   DOB: 1961/05/08, 53 y.o.   MRN: [id number] [name] is now in a regular room and feels better.  He has had hydrotherapy the last few days to wash out his right great toe and foot wounds.  His WBC has still remained high.  Labs are not back yet today.  Also, his right foot MRI has not had an official read yet.  On exam, the foot still is swollen and red.  A little improvement since admission, but still with a foul odor.  He is reluctant to have further surgery or a great toe amputation.  Will have PT wound service perform hydrotherapy at the bedside again today and will re-assess tomorrow.

## 2014-03-22 NOTE — Progress Notes (Addendum)
ANTIBIOTIC CONSULT NOTE - FOLLOW UP  Pharmacy Consult for Zosyn Indication: sepsis/diabetic R foot infection w/ abscess  No Known Allergies  Patient Measurements: Height:  (185.4 cm) Weight: 215 lb (97.523 kg) IBW/kg (Calculated) : 79.9  Vital Signs: Temp: 97.8 F (36.6 C) (09/12 0606) Temp src: Oral (09/12 0606) BP: 129/63 mmHg (09/12 0606) Pulse Rate: 90 (09/12 0606) Intake/Output from previous day: 09/11 0701 - 09/12 0700 In: 1070 [P.O.:520; I.V.:500; IV Piggyback:50] Out: 2750 [Urine:2750] Intake/Output from this shift:    Labs:  Recent Labs  03/19/14 1740 03/20/14 0115 03/20/14 0500 03/22/14 0705  WBC 19.1* 14.9* 15.4*  --   HGB 11.1* 9.1* 9.5*  --   PLT 214 177 191  --   CREATININE 3.21* 2.71* 2.44* 1.40*   Estimated Creatinine Clearance: 75 ml/min (by C-G formula based on Cr of 1.4). No results found for this basename: VANCOTROUGH, VANCOPEAK, VANCORANDOM, GENTTROUGH, GENTPEAK, GENTRANDOM, TOBRATROUGH, TOBRAPEAK, TOBRARND, AMIKACINPEAK, AMIKACINTROU, AMIKACIN,  in the last 72 hours   Microbiology: Recent Results (from the past 720 hour(s))  CULTURE, BLOOD (ROUTINE X 2)     Status: None   Collection Time    03/19/14  8:53 PM      Result Value Ref Range Status   Specimen Description BLOOD FOREARM LEFT   Final   Special Requests BOTTLES DRAWN AEROBIC AND ANAEROBIC 5CC   Final   Culture  Setup Time     Final   Value: 03/20/2014 01:03     Performed at [facility]   Culture     Final   Value:        BLOOD CULTURE RECEIVED NO GROWTH TO DATE CULTURE WILL BE HELD FOR 5 DAYS BEFORE ISSUING A FINAL NEGATIVE REPORT     Performed at [facility]   Report Status PENDING   Incomplete  CULTURE, BLOOD (ROUTINE X 2)     Status: None   Collection Time    03/19/14  9:08 PM      Result Value Ref Range Status   Specimen Description BLOOD ARM RIGHT   Final   Special Requests BOTTLES DRAWN AEROBIC AND ANAEROBIC 5CC   Final   Culture  Setup Time      Final   Value: 03/20/2014 01:01     Performed at [facility]   Culture     Final   Value:        BLOOD CULTURE RECEIVED NO GROWTH TO DATE CULTURE WILL BE HELD FOR 5 DAYS BEFORE ISSUING A FINAL NEGATIVE REPORT     Performed at [facility]   Report Status PENDING   Incomplete  URINE CULTURE     Status: None   Collection Time    03/19/14 10:08 PM      Result Value Ref Range Status   Specimen Description URINE, CLEAN CATCH   Final   Special Requests NONE   Final   Culture  Setup Time     Final   Value: 03/20/2014 04:21     Performed at [facility] Count     Final   Value: NO GROWTH     Performed at [facility]   Culture     Final   Value: NO GROWTH     Performed at [facility]   Report Status 03/21/2014 FINAL   Final  MRSA PCR SCREENING     Status: None   Collection Time    03/20/14  1:39  AM      Result Value Ref Range Status   MRSA by PCR NEGATIVE  NEGATIVE Final   Comment:            The GeneXpert MRSA Assay (FDA     approved for NASAL specimens     only), is one component of a     comprehensive MRSA colonization     surveillance program. It is not     intended to diagnose MRSA     infection nor to guide or     monitor treatment for     MRSA infections.    Anti-infectives   Start     Dose/Rate Route Frequency Ordered Stop   03/20/14 0400  piperacillin-tazobactam (ZOSYN) IVPB 3.375 g     3.375 g 12.5 mL/hr over 240 Minutes Intravenous Every 8 hours 03/19/14 2041     03/19/14 2100  vancomycin (VANCOCIN) 1,250 mg in sodium chloride 0.9 % 250 mL IVPB  Status:  Discontinued     1,250 mg 166.7 mL/hr over 90 Minutes Intravenous Every 24 hours 03/19/14 2038 03/21/14 0924   03/19/14 2030  piperacillin-tazobactam (ZOSYN) IVPB 3.375 g     3.375 g 100 mL/hr over 30 Minutes Intravenous  Once 03/19/14 2016 03/19/14 2133   03/19/14 2030  vancomycin (VANCOCIN) IVPB 1000 mg/200 mL premix  Status:  Discontinued     1,000 mg 200  mL/hr over 60 Minutes Intravenous  Once 03/19/14 2016 03/19/14 2038      Assessment: 53 yo M admitted 9/9 with diabetic foot abscess. Pharmacy consulted to dose vanc/zosyn for treatment and r/o sepsis. Now just monotherapy with zosyn. Pt s/p hydrotherapy. WBC 15.4, afebrile, SCr improving 2.44>>1.4, CrCl ~75, good UOP 1.2 mL/kg/hr.   Vanc 9/9 >> 9/11 Zosyn 9/9 >>  9/9 UCx >> negative 9/9 BCx >> ngtd MRSA negative  Goal of Therapy:  Eradication of infection  Plan:  - Continue Zosyn 3.375 g IV Q 8 hrs - Monitor renal function, clinical progress, and cultures  Thank you for allowing pharmacy to be part of this patient's care team  Metta Koranda M. Yuritza Paulhus, Pharm.D Clinical Pharmacy Resident Pager: [phone] 03/22/2014 .10:29 AM  _____________________________ 03/22/14 @ 10:56  Pharmacy consulted to restart Vancomycin SCr 1.4, CrCl~75, UOP 1.2 mL/kg/hr  Plan: Start Vanc  IV q12h Monitor renal function, clinical progress, and cultures VT at steady state if clinically warranted  Thank you for allowing pharmacy to be part of this patient's care team  Dorris Vangorder M. Kabir Brannock, Pharm.D Clinical Pharmacy Resident Pager: [phone] 03/22/2014 .10:58 AM

## 2014-03-22 NOTE — Progress Notes (Signed)
Physical Therapy Wound Treatment Patient Details  Name: Calvin Macias MRN: [id number] Date of Birth: 05-Jan-1961  Today's Date: 03/22/2014 Time: 1331-1410 Time Calculation (min): 39 min  Subjective  Subjective: Pt reports that wound area is redder today.  Pain Score:  Denies  Wound Assessment  Wound / Incision (Open or Dehisced) 03/19/14 Foot Right (Active)  Dressing Type Moist to dry;Gauze (Comment) 03/22/2014  2:29 PM  Dressing Changed Changed 03/22/2014  2:29 PM  Dressing Status Clean;Dry;Intact 03/22/2014  2:29 PM  Dressing Change Frequency PRN 03/21/2014  4:00 PM  Site / Wound Assessment Dressing in place / Unable to assess 03/21/2014  4:00 PM  % Wound base Red or Granulating 65% 03/22/2014  2:29 PM  % Wound base Yellow 35% 03/22/2014  2:29 PM  % Wound base Black 0% 03/22/2014  2:29 PM  % Wound base Other (Comment) 0% 03/22/2014  2:29 PM  Peri-wound Assessment Erythema (blanchable);Edema;Denuded 03/22/2014  2:29 PM  Wound Length (cm) 3 cm 03/21/2014  9:30 AM  Wound Width (cm) 1 cm 03/20/2014 10:40 AM  Undermining (cm) 3 to 4 cm dorsal surface of great toe due to gas pockets 03/20/2014 10:40 AM  Margins Unattached edges (unapproximated) 03/22/2014  2:29 PM  Closure None 03/22/2014  2:29 PM  Drainage Amount Moderate 03/22/2014  2:29 PM  Drainage Description Odor;Purulent 03/22/2014  2:29 PM  Non-staged Wound Description Full thickness 03/22/2014  2:29 PM  Treatment Debridement (Selective);Hydrotherapy (Pulse lavage);Packing (Saline gauze) 03/22/2014  2:29 PM   Hydrotherapy Pulsed lavage therapy - wound location: R great toe communicating wound (s) Pulsed Lavage with Suction (psi): 8 psi Pulsed Lavage with Suction - Normal Saline Used: 1000 mL Pulsed Lavage Tip: Tunneling tip Selective Debridement Selective Debridement - Location: dorsal area of wound Selective Debridement - Tools Used: Forceps;Scissors Selective Debridement - Tissue Removed: yellow necrotic tissue   Wound Assessment and  Plan  Wound Therapy - Assess/Plan/Recommendations Wound Therapy - Clinical Statement: Pt continues to have purulent drainage with odor. Per pt report incr redness of toe. Wound Therapy - Functional Problem List: Will hinder ambulation at this point. Factors Delaying/Impairing Wound Healing: Diabetes Mellitus;Infection - systemic/local Hydrotherapy Plan: Debridement;Dressing change;Pulsatile lavage with suction;Patient/family education Wound Therapy - Frequency: Other (comment) (order for 9/12(Saturday) and 9/14 (Monday)) Wound Therapy - Current Recommendations: Surgery consult (following pt.) Wound Therapy - Follow Up Recommendations: Home health RN  Wound Therapy Goals- Improve the function of patient's integumentary system by progressing the wound(s) through the phases of wound healing (inflammation - proliferation - remodeling) by: Decrease Necrotic Tissue to: 20% visible Decrease Necrotic Tissue - Progress: Not progressing Increase Granulation Tissue to: 75% visible Increase Granulation Tissue - Progress: Mot progressing Improve Drainage Characteristics: Min;Serous Improve Drainage Characteristics - Progress: Not progressing  Goals will be updated until maximal potential achieved or discharge criteria met.  Discharge criteria: when goals achieved, discharge from hospital, MD decision/surgical intervention, no progress towards goals, refusal/missing three consecutive treatments without notification or medical reason.  GP     Calvin Macias 03/22/2014, 2:35 PM  [facility] [phone]

## 2014-03-22 NOTE — Progress Notes (Signed)
Patient ID: Calvin Macias  male  ZOX:[id number]    DOB: 02-Mar-1961    DOA: 03/19/2014  PCP: Shirlean Mylar, D, MD  Brief history of present illness INITIAL PRESENTATION: 53 y.o. M brought to ED on 9/9 with right toe pain and swelling x 4 days. In ED, Xray revealed multiple foci of gas. Orthopedics was consulted and they performed bedside I&D. Pt remained hypotensive despite 4L IVF's. Patient was admitted to ICU for severe sepsis. Patient was weaned off of vasopressors on 9/10, he has remained stable, transferred to [facility], [facility] care on 9/12. Patient has been undergoing hydrotherapy to wash out right great toe and foot wounds.   Assessment/Plan: Principal Problem:   Sepsis secondary to Right foot ulcer - Currently stable, off of vasopressors - BP stable, continue to hold his antihypertensives today - Continue IV vancomycin and Zosyn - Orthopedics following, MRI of the foot shows extensive anaerobic infection surrounding the first metatarsophalangeal joint and base of great toe without underlying osteoarthritis or joint sepsis, gas in the soft tissues - Continue hydrotherapy today, per orthopedics, reassess in am  Active Problems:    Hypertension - Currently stable, borderline, hold off on antihypertensives    Type 2 diabetes mellitus - Controlled, continue Lantus, meal coverage, sliding scale insulin    AKI (acute kidney injury): Likely worsened due to Severe sepsis, HCTZ and ACE inhibitor in the setting of hypotension  - Continue gentle hydration, improving   DVT Prophylaxis:Heparin subcutaneous  Code Status:Full code  Family Communication:  Disposition:  Consultants: Critical care service  Orthopedics, Dr. Magnus Ivan  Procedures:  Hydrotherapy  Antibiotics:  IV vancomycin  IV Zosyn  Subjective: Patient seen and examined, no acute complaints at this time, the pain is controlled, awaiting hydrotherapy  Objective: Weight change:   Intake/Output Summary (Last 24  hours) at 03/22/14 1021 Last data filed at 03/22/14 8119  Gross per 24 hour  Intake    920 ml  Output   2050 ml  Net  -1130 ml   Blood pressure 129/63, pulse 90, temperature 97.8 F (36.6 C), temperature source Oral, resp. rate 14, height  (1.854 m), weight 97.523 kg (215 lb), SpO2 93.00%.  Physical Exam: General: Alert and awake, oriented x3, not in any acute distress. CVS: S1-S2 clear, no murmur rubs or gallops Chest: clear to auscultation bilaterally, no wheezing, rales or rhonchi Abdomen: soft nontender, nondistended, normal bowel sounds  Extremities: no cyanosis, clubbing or edema noted bilaterally, Right foot dressing intact  Neuro: Cranial nerves II-XII intact, no focal neurological deficits  Lab Results: Basic Metabolic Panel:  Recent Labs Lab 03/20/14 0500 03/22/14 0705  NA 132* 140  K 4.9 4.9  CL 101 106  CO2 15* 16*  GLUCOSE 253* 111*  BUN 55* 24*  CREATININE 2.44* 1.40*  CALCIUM 7.6* 8.9   Liver Function Tests:  Recent Labs Lab 03/19/14 1740 03/20/14 0500  AST 17 12  ALT 16 13  ALKPHOS 73 46  BILITOT 0.5 0.3  PROT 7.5 5.9*  ALBUMIN 3.6 2.6*   No results found for this basename: LIPASE, AMYLASE,  in the last 168 hours No results found for this basename: AMMONIA,  in the last 168 hours CBC:  Recent Labs Lab 03/19/14 1740 03/20/14 0115 03/20/14 0500  WBC 19.1* 14.9* 15.4*  NEUTROABS 15.2*  --   --   HGB 11.1* 9.1* 9.5*  HCT 31.5* 26.3* 27.4*  MCV 84.9 86.2 85.9  PLT 214 177 191   Cardiac Enzymes: No results  found for this basename: CKTOTAL, CKMB, CKMBINDEX, TROPONINI,  in the last 168 hours BNP: No components found with this basename: POCBNP,  CBG:  Recent Labs Lab 03/21/14 0813 03/21/14 1233 03/21/14 1725 03/21/14 2138 03/22/14 0631  GLUCAP 144* 136* 170* 116* 131*     Micro Results: Recent Results (from the past 240 hour(s))  CULTURE, BLOOD (ROUTINE X 2)     Status: None   Collection Time    03/19/14  8:53 PM       Result Value Ref Range Status   Specimen Description BLOOD FOREARM LEFT   Final   Special Requests BOTTLES DRAWN AEROBIC AND ANAEROBIC 5CC   Final   Culture  Setup Time     Final   Value: 03/20/2014 01:03     Performed at [facility]   Culture     Final   Value:        BLOOD CULTURE RECEIVED NO GROWTH TO DATE CULTURE WILL BE HELD FOR 5 DAYS BEFORE ISSUING A FINAL NEGATIVE REPORT     Performed at [facility]   Report Status PENDING   Incomplete  CULTURE, BLOOD (ROUTINE X 2)     Status: None   Collection Time    03/19/14  9:08 PM      Result Value Ref Range Status   Specimen Description BLOOD ARM RIGHT   Final   Special Requests BOTTLES DRAWN AEROBIC AND ANAEROBIC 5CC   Final   Culture  Setup Time     Final   Value: 03/20/2014 01:01     Performed at [facility]   Culture     Final   Value:        BLOOD CULTURE RECEIVED NO GROWTH TO DATE CULTURE WILL BE HELD FOR 5 DAYS BEFORE ISSUING A FINAL NEGATIVE REPORT     Performed at [facility]   Report Status PENDING   Incomplete  URINE CULTURE     Status: None   Collection Time    03/19/14 10:08 PM      Result Value Ref Range Status   Specimen Description URINE, CLEAN CATCH   Final   Special Requests NONE   Final   Culture  Setup Time     Final   Value: 03/20/2014 04:21     Performed at [facility] Count     Final   Value: NO GROWTH     Performed at [facility]   Culture     Final   Value: NO GROWTH     Performed at [facility]   Report Status 03/21/2014 FINAL   Final  MRSA PCR SCREENING     Status: None   Collection Time    03/20/14  1:39 AM      Result Value Ref Range Status   MRSA by PCR NEGATIVE  NEGATIVE Final   Comment:            The GeneXpert MRSA Assay (FDA     approved for NASAL specimens     only), is one component of a     comprehensive MRSA colonization     surveillance program. It is not     intended to diagnose MRSA     infection nor to  guide or     monitor treatment for     MRSA infections.    Studies/Results: Mr Foot Right Wo Contrast  03/22/2014   CLINICAL DATA:  Nonhealing ulcer on  the ball of the right foot.  EXAM: MRI OF THE RIGHT FOREFOOT WITHOUT CONTRAST  TECHNIQUE: Multiplanar, multisequence MR imaging was performed. No intravenous contrast was administered.  COMPARISON:  Radiograph dated 03/19/2014  FINDINGS: There is circumferential cellulitis in the base of the right great toe and is circumferentially around the first metatarsal phalangeal joint with gas in the soft tissues consistent anaerobic infection. However, there is no osteomyelitis. There is no joint effusion to suggest a septic joint of the great toe. Sesamoids demonstrate no significant abnormality. The other visualized bones of the forefoot are normal.  IMPRESSION: Extensive anaerobic infection surrounding the first metatarsophalangeal joint and base of the great toe without underlying osteomyelitis or joint sepsis. There is gas in the soft tissues.   Electronically Signed   By: Geanie Cooley M.D.   On: 03/22/2014 09:02   Dg Chest Port 1 View  03/20/2014   CLINICAL DATA:  Central line placement  EXAM: PORTABLE CHEST - 1 VIEW  COMPARISON:  09/03/2009  FINDINGS: Left central venous catheter has been placed. The catheter tip overlies a superimposed EKG lead but appears to be in the mid the mediastinum consistent with location in the brachiocephalic vein. No pneumothorax. Shallow inspiration. Heart size and pulmonary vascularity are normal. Mild linear atelectasis in the lung bases. Similar appearance to previous study.  IMPRESSION: Left central venous catheter tip appears to localize to the central brachiocephalic vein. Shallow inspiration with atelectasis in the lung bases.   Electronically Signed   By: Burman Nieves M.D.   On: 03/20/2014 02:42   Dg Foot Complete Right  03/19/2014   CLINICAL DATA:  Foot pain for 5 years, history diabetes  EXAM: RIGHT FOOT COMPLETE  - 3+ VIEW  COMPARISON:  09/01/2009  FINDINGS: Dressing artifacts at great toe.  Remaining joint spaces preserved.  No acute fracture or dislocation.  Soft tissue swelling great toe with multiple foci of soft tissue gas consistent with soft tissue infection by gas-forming organism.  While some of the observed soft tissue gas is in close proximity to bones within the great toe, no definite bone destruction is seen to suggest osteomyelitis.  Old corticated erosion at the medial margin of the base of the proximal phalanx great toe question related to degenerative changes or an inflammatory arthropathy such as gout.  Diffuse osseous demineralization.  Advanced degenerative changes of first MTP joint.  Narrowing in destruction with bony debris at intertarsal joints compatible with a Charcot changes with midfoot collapse and pes planus.  Large plantar calcaneal spur.  IMPRESSION: Extensive soft tissue swelling in the great toe a region with multiple foci of soft tissue gas compatible with infection by a gas-forming organism.  No definite evidence of osteomyelitis though if this remains a clinical concern recommend MR imaging.  Advanced Charcot changes of the midfoot.   Electronically Signed   By: Ulyses Southward M.D.   On: 03/19/2014 18:13    Medications: Scheduled Meds: . antiseptic oral rinse  7 mL Mouth Rinse BID  . aspirin EC  81 mg Oral q morning - 10a  . heparin  5,000 Units Subcutaneous 3 times per day  . insulin aspart  0-15 Units Subcutaneous TID WC  . insulin aspart  0-5 Units Subcutaneous QHS  . insulin aspart  3 Units Subcutaneous TID WC  . insulin glargine  10 Units Subcutaneous Daily  . ondansetron (ZOFRAN) IV  4 mg Intravenous 3 times per day  . piperacillin-tazobactam (ZOSYN)  IV  3.375 g Intravenous Q8H  .  simvastatin  40 mg Oral q1800      LOS: 3 days   RAI,RIPUDEEP M.D. [facility] 03/22/2014, 10:21 AM Pager: [phone]  If 7PM-7AM, please contact  night-coverage [email or web address] Password [email or web address]  **Disclaimer: This note was dictated with voice recognition software. Similar sounding words can inadvertently be transcribed and this note may contain transcription errors which may not have been corrected upon publication of note.**

## 2014-03-23 DIAGNOSIS — I10 Essential (primary) hypertension: Secondary | ICD-10-CM

## 2014-03-23 LAB — BASIC METABOLIC PANEL
Anion gap: 17 — ABNORMAL HIGH (ref 5–15)
BUN: 21 mg/dL (ref 6–23)
CO2: 19 mEq/L (ref 19–32)
Calcium: 9.2 mg/dL (ref 8.4–10.5)
Chloride: 101 mEq/L (ref 96–112)
Creatinine, Ser: 1.26 mg/dL (ref 0.50–1.35)
GFR calc Af Amer: 74 mL/min — ABNORMAL LOW (ref >90)
GFR, EST NON AFRICAN AMERICAN: 64 mL/min — AB (ref >90)
GLUCOSE: 198 mg/dL — AB (ref 70–99)
Potassium: 5.1 mEq/L (ref 3.7–5.3)
Sodium: 137 mEq/L (ref 137–147)

## 2014-03-23 LAB — CBC
HEMATOCRIT: 29.6 % — AB (ref 39.0–52.0)
Hemoglobin: 10.1 g/dL — ABNORMAL LOW (ref 13.0–17.0)
MCH: 29.4 pg (ref 26.0–34.0)
MCHC: 34.1 g/dL (ref 30.0–36.0)
MCV: 86 fL (ref 78.0–100.0)
Platelets: 281 10*3/uL (ref 150–400)
RBC: 3.44 MIL/uL — ABNORMAL LOW (ref 4.22–5.81)
RDW: 14.5 % (ref 11.5–15.5)
WBC: 13 10*3/uL — ABNORMAL HIGH (ref 4.0–10.5)

## 2014-03-23 LAB — GLUCOSE, CAPILLARY
GLUCOSE-CAPILLARY: 173 mg/dL — AB (ref 70–99)
GLUCOSE-CAPILLARY: 239 mg/dL — AB (ref 70–99)
Glucose-Capillary: 135 mg/dL — ABNORMAL HIGH (ref 70–99)
Glucose-Capillary: 201 mg/dL — ABNORMAL HIGH (ref 70–99)

## 2014-03-23 NOTE — Progress Notes (Signed)
Patient ID: Calvin Macias, male   DOB: 03/21/1961, 53 y.o.   MRN: [id number] MRI showed no osteo in the bones of his right foot including the great toe.  There is no further evidence of an abscess.  I probed the wound deeply this am and found no gross purulence.  Will have dial soap soak applied to right foot today then one more round of hydrotherapy tomorrow prior to discharge to home on oral antibiotics.  This will continue to be a chronic wound and he will need follow-up at an outpatient wound center as well as continued diabetic control.

## 2014-03-23 NOTE — Progress Notes (Signed)
Orthopedic Tech Progress Note Patient Details:  Calvin Macias [birth year] [id number]  Ortho Devices Type of Ortho Device: Postop shoe/boot   [name] 03/23/2014, 12:37 PM

## 2014-03-23 NOTE — Progress Notes (Signed)
Patient ID: Calvin Macias  male  JXB:[id number]    DOB: 09/15/1960    DOA: 03/19/2014  PCP: Shirlean Mylar, D, MD  Brief history of present illness INITIAL PRESENTATION: 53 y.o. M brought to ED on 9/9 with right toe pain and swelling x 4 days. In ED, Xray revealed multiple foci of gas. Orthopedics was consulted and they performed bedside I&D. Pt remained hypotensive despite 4L IVF's. Patient was admitted to ICU for severe sepsis. Patient was weaned off of vasopressors on 9/10, he has remained stable, transferred to [facility] care on 9/12. Patient has been undergoing hydrotherapy to wash out right great toe and foot wounds.   Assessment/Plan: Principal Problem:   Sepsis secondary to Right foot ulcer - Currently stable, off of vasopressors - BP stable but soft, continue to hold his antihypertensives today - Continue IV vancomycin and Zosyn, will transition to oral antibiotics in a.m. - Orthopedics following, MRI of the foot shows extensive anaerobic infection surrounding the first metatarsophalangeal joint and base of great toe without underlying osteoarthritis or joint sepsis, gas in the soft tissues - Per orthopedics, this will be a chronic wound, Continue hydrotherapy tomorrow and hopefully DC in a.m. after hydrotherapy. Patient will followup with orthopedics in office  Active Problems:    Hypertension - Currently stable, borderline, hold off on antihypertensives    Type 2 diabetes mellitus - Controlled, continue Lantus, meal coverage, sliding scale insulin    AKI (acute kidney injury): Likely worsened due to Severe sepsis, HCTZ and ACE inhibitor in the setting of hypotension  - Continue gentle hydration, improving - Labs still pending, ordered stat   DVT Prophylaxis:Heparin subcutaneous  Code Status:Full code  Family Communication:  Disposition: Hopefully DC home in a.m.  Consultants: Critical care service  Orthopedics, Dr.  Magnus Ivan  Procedures:  Hydrotherapy  Antibiotics:  IV vancomycin  IV Zosyn  Subjective: Patient seen and examined, no acute complaints, no fevers, chills, nausea vomiting  Objective: Weight change: -0.091 kg (-3.2 oz)  Intake/Output Summary (Last 24 hours) at 03/23/14 1035 Last data filed at 03/23/14 0615  Gross per 24 hour  Intake    240 ml  Output   2400 ml  Net  -2160 ml   Blood pressure 117/61, pulse 75, temperature 98.4 F (36.9 C), temperature source Oral, resp. rate 18, height  (1.854 m), weight 98.022 kg (216 lb 1.6 oz), SpO2 97.00%.  Physical Exam: General: Alert and awake, oriented x3, NAD CVS: S1-S2 clear, no murmur rubs or gallops Chest: clear to auscultation bilaterally, no wheezing, rales or rhonchi Abdomen: soft nontender, nondistended, normal bowel sounds  Extremities: no c/c/e bilaterally, Right foot dressing intact    Lab Results: Basic Metabolic Panel:  Recent Labs Lab 03/20/14 0500 03/22/14 0705  NA 132* 140  K 4.9 4.9  CL 101 106  CO2 15* 16*  GLUCOSE 253* 111*  BUN 55* 24*  CREATININE 2.44* 1.40*  CALCIUM 7.6* 8.9   Liver Function Tests:  Recent Labs Lab 03/19/14 1740 03/20/14 0500  AST 17 12  ALT 16 13  ALKPHOS 73 46  BILITOT 0.5 0.3  PROT 7.5 5.9*  ALBUMIN 3.6 2.6*   No results found for this basename: LIPASE, AMYLASE,  in the last 168 hours No results found for this basename: AMMONIA,  in the last 168 hours CBC:  Recent Labs Lab 03/19/14 1740  03/20/14 0500 03/22/14 1140  WBC 19.1*  < > 15.4* 13.4*  NEUTROABS 15.2*  --   --   --  HGB 11.1*  < > 9.5* 9.7*  HCT 31.5*  < > 27.4* 27.8*  MCV 84.9  < > 85.9 85.5  PLT 214  < > 191 248  < > = values in this interval not displayed. Cardiac Enzymes: No results found for this basename: CKTOTAL, CKMB, CKMBINDEX, TROPONINI,  in the last 168 hours BNP: No components found with this basename: POCBNP,  CBG:  Recent Labs Lab 03/22/14 0631 03/22/14 1231  03/22/14 1630 03/22/14 2133 03/23/14 0621  GLUCAP 131* 175* 159* 157* 135*     Micro Results: Recent Results (from the past 240 hour(s))  CULTURE, BLOOD (ROUTINE X 2)     Status: None   Collection Time    03/19/14  8:53 PM      Result Value Ref Range Status   Specimen Description BLOOD FOREARM LEFT   Final   Special Requests BOTTLES DRAWN AEROBIC AND ANAEROBIC 5CC   Final   Culture  Setup Time     Final   Value: 03/20/2014 01:03     Performed at [facility]   Culture     Final   Value:        BLOOD CULTURE RECEIVED NO GROWTH TO DATE CULTURE WILL BE HELD FOR 5 DAYS BEFORE ISSUING A FINAL NEGATIVE REPORT     Performed at [facility]   Report Status PENDING   Incomplete  CULTURE, BLOOD (ROUTINE X 2)     Status: None   Collection Time    03/19/14  9:08 PM      Result Value Ref Range Status   Specimen Description BLOOD ARM RIGHT   Final   Special Requests BOTTLES DRAWN AEROBIC AND ANAEROBIC 5CC   Final   Culture  Setup Time     Final   Value: 03/20/2014 01:01     Performed at [facility]   Culture     Final   Value:        BLOOD CULTURE RECEIVED NO GROWTH TO DATE CULTURE WILL BE HELD FOR 5 DAYS BEFORE ISSUING A FINAL NEGATIVE REPORT     Performed at [facility]   Report Status PENDING   Incomplete  URINE CULTURE     Status: None   Collection Time    03/19/14 10:08 PM      Result Value Ref Range Status   Specimen Description URINE, CLEAN CATCH   Final   Special Requests NONE   Final   Culture  Setup Time     Final   Value: 03/20/2014 04:21     Performed at [facility] Count     Final   Value: NO GROWTH     Performed at [facility]   Culture     Final   Value: NO GROWTH     Performed at [facility]   Report Status 03/21/2014 FINAL   Final  MRSA PCR SCREENING     Status: None   Collection Time    03/20/14  1:39 AM      Result Value Ref Range Status   MRSA by PCR NEGATIVE  NEGATIVE Final   Comment:             The GeneXpert MRSA Assay (FDA     approved for NASAL specimens     only), is one component of a     comprehensive MRSA colonization     surveillance program. It is not  intended to diagnose MRSA     infection nor to guide or     monitor treatment for     MRSA infections.    Studies/Results: Mr Foot Right Wo Contrast  03/22/2014   CLINICAL DATA:  Nonhealing ulcer on the ball of the right foot.  EXAM: MRI OF THE RIGHT FOREFOOT WITHOUT CONTRAST  TECHNIQUE: Multiplanar, multisequence MR imaging was performed. No intravenous contrast was administered.  COMPARISON:  Radiograph dated 03/19/2014  FINDINGS: There is circumferential cellulitis in the base of the right great toe and is circumferentially around the first metatarsal phalangeal joint with gas in the soft tissues consistent anaerobic infection. However, there is no osteomyelitis. There is no joint effusion to suggest a septic joint of the great toe. Sesamoids demonstrate no significant abnormality. The other visualized bones of the forefoot are normal.  IMPRESSION: Extensive anaerobic infection surrounding the first metatarsophalangeal joint and base of the great toe without underlying osteomyelitis or joint sepsis. There is gas in the soft tissues.   Electronically Signed   By: Geanie Cooley M.D.   On: 03/22/2014 09:02   Dg Chest Port 1 View  03/20/2014   CLINICAL DATA:  Central line placement  EXAM: PORTABLE CHEST - 1 VIEW  COMPARISON:  09/03/2009  FINDINGS: Left central venous catheter has been placed. The catheter tip overlies a superimposed EKG lead but appears to be in the mid the mediastinum consistent with location in the brachiocephalic vein. No pneumothorax. Shallow inspiration. Heart size and pulmonary vascularity are normal. Mild linear atelectasis in the lung bases. Similar appearance to previous study.  IMPRESSION: Left central venous catheter tip appears to localize to the central brachiocephalic vein. Shallow  inspiration with atelectasis in the lung bases.   Electronically Signed   By: Burman Nieves M.D.   On: 03/20/2014 02:42   Dg Foot Complete Right  03/19/2014   CLINICAL DATA:  Foot pain for 5 years, history diabetes  EXAM: RIGHT FOOT COMPLETE - 3+ VIEW  COMPARISON:  09/01/2009  FINDINGS: Dressing artifacts at great toe.  Remaining joint spaces preserved.  No acute fracture or dislocation.  Soft tissue swelling great toe with multiple foci of soft tissue gas consistent with soft tissue infection by gas-forming organism.  While some of the observed soft tissue gas is in close proximity to bones within the great toe, no definite bone destruction is seen to suggest osteomyelitis.  Old corticated erosion at the medial margin of the base of the proximal phalanx great toe question related to degenerative changes or an inflammatory arthropathy such as gout.  Diffuse osseous demineralization.  Advanced degenerative changes of first MTP joint.  Narrowing in destruction with bony debris at intertarsal joints compatible with a Charcot changes with midfoot collapse and pes planus.  Large plantar calcaneal spur.  IMPRESSION: Extensive soft tissue swelling in the great toe a region with multiple foci of soft tissue gas compatible with infection by a gas-forming organism.  No definite evidence of osteomyelitis though if this remains a clinical concern recommend MR imaging.  Advanced Charcot changes of the midfoot.   Electronically Signed   By: Ulyses Southward M.D.   On: 03/19/2014 18:13    Medications: Scheduled Meds: . antiseptic oral rinse  7 mL Mouth Rinse BID  . aspirin EC  81 mg Oral q morning - 10a  . heparin  5,000 Units Subcutaneous 3 times per day  . insulin aspart  0-15 Units Subcutaneous TID WC  . insulin aspart  0-5 Units  Subcutaneous QHS  . insulin aspart  3 Units Subcutaneous TID WC  . insulin glargine  10 Units Subcutaneous Daily  . ondansetron (ZOFRAN) IV  4 mg Intravenous 3 times per day  .  piperacillin-tazobactam (ZOSYN)  IV  3.375 g Intravenous Q8H  . simvastatin  40 mg Oral q1800  . vancomycin  1,250 mg Intravenous Q12H      LOS: 4 days   Jamorris Ndiaye M.D. [facility] 03/23/2014, 10:35 AM Pager: [phone]  If 7PM-7AM, please contact night-coverage [email or web address] Password [email or web address]  **Disclaimer: This note was dictated with voice recognition software. Similar sounding words can inadvertently be transcribed and this note may contain transcription errors which may not have been corrected upon publication of note.**

## 2014-03-24 LAB — BASIC METABOLIC PANEL
ANION GAP: 17 — AB (ref 5–15)
BUN: 19 mg/dL (ref 6–23)
CHLORIDE: 102 meq/L (ref 96–112)
CO2: 22 mEq/L (ref 19–32)
Calcium: 9.7 mg/dL (ref 8.4–10.5)
Creatinine, Ser: 1.27 mg/dL (ref 0.50–1.35)
GFR calc non Af Amer: 63 mL/min — ABNORMAL LOW (ref >90)
GFR, EST AFRICAN AMERICAN: 73 mL/min — AB (ref >90)
Glucose, Bld: 212 mg/dL — ABNORMAL HIGH (ref 70–99)
POTASSIUM: 4.6 meq/L (ref 3.7–5.3)
SODIUM: 141 meq/L (ref 137–147)

## 2014-03-24 LAB — CBC
HCT: 32.4 % — ABNORMAL LOW (ref 39.0–52.0)
Hemoglobin: 10.9 g/dL — ABNORMAL LOW (ref 13.0–17.0)
MCH: 29.1 pg (ref 26.0–34.0)
MCHC: 33.6 g/dL (ref 30.0–36.0)
MCV: 86.4 fL (ref 78.0–100.0)
PLATELETS: 320 10*3/uL (ref 150–400)
RBC: 3.75 MIL/uL — ABNORMAL LOW (ref 4.22–5.81)
RDW: 14.2 % (ref 11.5–15.5)
WBC: 12.7 10*3/uL — AB (ref 4.0–10.5)

## 2014-03-24 LAB — GLUCOSE, CAPILLARY
GLUCOSE-CAPILLARY: 193 mg/dL — AB (ref 70–99)
Glucose-Capillary: 221 mg/dL — ABNORMAL HIGH (ref 70–99)

## 2014-03-24 MED ORDER — MUPIROCIN CALCIUM 2 % EX CREA: 1.0000 "application " | TOPICAL_CREAM | Freq: Every day | CUTANEOUS | Status: DC

## 2014-03-24 MED ORDER — DOXYCYCLINE HYCLATE 100 MG PO CAPS: 100.0000 mg | ORAL_CAPSULE | Freq: Two times a day (BID) | ORAL | Status: DC

## 2014-03-24 MED ORDER — AMOXICILLIN-POT CLAVULANATE 875-125 MG PO TABS: 1.0000 | ORAL_TABLET | Freq: Two times a day (BID) | ORAL | Status: DC

## 2014-03-24 NOTE — Discharge Instructions (Signed)
Starting tomorrow 9/15, you should soak your foot daily in soapy water (antibacterial soap) for 20 minutes. After soaking your foot, place a small amount of Bactroban ointment over your wounds followed by a dry dressing once daily.

## 2014-03-24 NOTE — Discharge Summary (Signed)
Physician Discharge Summary  Patient ID: VANDEN FAWAZ MRN: [id number] DOB/AGE: 1960-10-05 53 y.o.  Admit date: 03/19/2014 Discharge date: 03/24/2014  Primary Care Physician:  Shirlean Mylar, D, MD  Discharge Diagnoses:    . Right foot ulcer  . Sepsis secondary to right foot ulcer  . Hypertension . Type 2 diabetes mellitus . AKI (acute kidney injury) secondary to sepsis    Consults: Orthopedics, Dr. Magnus Ivan   Recommendations for Outpatient Follow-up:   Patient was recommended dressing changes daily until followup with wound care center  Patient was recommended to hold off on his antihypertensives until BP is more stable, instructions given to the patient when to resume.  Allergies:  Not on File   Discharge Medications:   Medication List    STOP taking these medications       benazepril 20 MG tablet  Commonly known as:  LOTENSIN     hydrochlorothiazide 12.5 MG capsule  Commonly known as:  MICROZIDE      TAKE these medications       amoxicillin-clavulanate 875-125 MG per tablet  Commonly known as:  AUGMENTIN  Take 1 tablet by mouth 2 (two) times daily. X 2 weeks     aspirin 81 MG tablet  Take 81 mg by mouth every morning.     doxycycline 100 MG capsule  Commonly known as:  VIBRAMYCIN  Take 1 capsule (100 mg total) by mouth 2 (two) times daily. X 2 weeks     glipiZIDE 10 MG tablet  Commonly known as:  GLUCOTROL  Take 10 tablets by mouth 2 (two) times daily.     INVOKANA 100 MG Tabs  Generic drug:  Canagliflozin  Take 100 mg by mouth daily.     MENS LIFE PACK Tabs  Take 1 each by mouth daily.     metFORMIN 1000 MG tablet  Commonly known as:  GLUCOPHAGE  Take 1,000 mg by mouth 2 (two) times daily.     mupirocin cream 2 %  Commonly known as:  BACTROBAN  Apply 1 application topically daily. Apply to the foot daily     pioglitazone 45 MG tablet  Commonly known as:  ACTOS  Take 45 mg by mouth daily.     simvastatin 40 MG tablet  Commonly known as:   ZOCOR  Take 40 mg by mouth daily.     VICTOZA 18 MG/3ML Sopn  Generic drug:  Liraglutide  Inject 1.8 mg into the skin daily.         Brief H and P: For complete details please refer to admission H and P, but in brief Calvin Macias is a 53 y.o. male with past medical history of type II diabetes, Charcot foot, neuropathy, HTN, HLD, who presents with right foot pain.  Patient reports that he started feeling bad 4 days ago, including fever, nausea and vomiting. He reports fever of 101 at 3 days ago. He vomited several times. In the following day, he noticed that his right great toe become swollen. It has been progressively getting worse. Patient states that he had a right foot callous removed by podiatrist in November. After that he has been using antibiotic cream. Patient does not have cough, chest pain, abdominal pain, diarrhea. No change in urinary habit. No rashes. X-ray showed extensive soft tissue swelling in the great toe a region with multiple foci of soft tissue gas compatible with infection by a gas-forming organism.  He is a very tachycardic. He has leukocytosis with WBC 19.1. He  also found to have worsening renal function with creatinine up from 1.35 on 09/09/09 to 3.21 on admission. Lactic acid 1.2. Patient was treated with IV fluid bolus, and started with vancomycin and Zosyn, admitted to inpatient.    Hospital Course:  INITIAL PRESENTATION: 53 y.o. M brought to ED on 9/9 with right toe pain and swelling x 4 days. In ED, Xray revealed multiple foci of gas. Orthopedics was consulted and they performed bedside I&D. Pt remained hypotensive despite 4L IVF's. Patient was admitted to ICU for severe sepsis.  Patient was weaned off of vasopressors on 9/10, he has remained stable, transferred to MedSurg bed. Patient underwent hydrotherapy to wash out right great toe and foot wounds  Sepsis secondary to Right foot ulcer : Currently resolved, patient was transferred to ICU in placed on  vasopressors. Patient has remained stable off of vasopressors since 9/10. His antihypertensives has been held. Patient was recommended to resume in a few days once BP is more stabilized.  Right foot ulcer: Patient was placed on IV vancomycin and Zosyn through the hospitalization for sepsis and right foot infection. Orthopedics was consulted and patient was followed by Dr Magnus Ivan. He had I&D done on admission on 9/9 by Dr Magnus Ivan. MRI of the foot showed extensive anaerobic infection surrounding the first metatarsophalangeal joint and base of great toe without underlying osteoarthritis or joint sepsis, gas in the soft tissues. Patient was placed on hydrotherapy daily to the right foot. Per orthopedics, this will be a chronic wound and patient will need extensive wound care, close followup with orthopedics. He was transitioned to oral doxycycline and Augmentin for 2 weeks. Patient will follow with wound care center, dressing changes daily until wound care followup.   Hypertension  - Currently stable, borderline, hold off on antihypertensives   Type 2 diabetes mellitus - resume home regimen, patient has an appointment with Dr. Talmage Coin  AKI (acute kidney injury): Likely worsened due to Severe sepsis, HCTZ and ACE inhibitor in the setting of hypotension , resolved, creatinine 1.2 at the time of discharge. Patient was admitted to hold off on his antihypertensives with parameters, instructions given when to resume.   Day of Discharge BP 107/58  Pulse 65  Temp(Src) 97.9 F (36.6 C) (Oral)  Resp 16  Ht  (1.854 m)  Wt 97.705 kg (215 lb 6.4 oz)  BMI 28.42 kg/m2  SpO2 98%  Physical Exam: General: Alert and awake oriented x3 not in any acute distress. CVS: S1-S2 clear no murmur rubs or gallops Chest: clear to auscultation bilaterally, no wheezing rales or rhonchi Abdomen: soft nontender, nondistended, normal bowel sounds Extremities: no cyanosis, clubbing or edema noted bilaterally, right  foot ulcer improving significantly from admission  Neuro: Cranial nerves II-XII intact, no focal neurological deficits   The results of significant diagnostics from this hospitalization (including imaging, microbiology, ancillary and laboratory) are listed below for reference.    LAB RESULTS: Basic Metabolic Panel:  Recent Labs Lab 03/22/14 0705 03/23/14 0918  NA 140 137  K 4.9 5.1  CL 106 101  CO2 16* 19  GLUCOSE 111* 198*  BUN 24* 21  CREATININE 1.40* 1.26  CALCIUM 8.9 9.2   Liver Function Tests:  Recent Labs Lab 03/19/14 1740 03/20/14 0500  AST 17 12  ALT 16 13  ALKPHOS 73 46  BILITOT 0.5 0.3  PROT 7.5 5.9*  ALBUMIN 3.6 2.6*   No results found for this basename: LIPASE, AMYLASE,  in the last 168 hours No results found  for this basename: AMMONIA,  in the last 168 hours CBC:  Recent Labs Lab 03/19/14 1740  03/22/14 1140 03/23/14 0918  WBC 19.1*  < > 13.4* 13.0*  NEUTROABS 15.2*  --   --   --   HGB 11.1*  < > 9.7* 10.1*  HCT 31.5*  < > 27.8* 29.6*  MCV 84.9  < > 85.5 86.0  PLT 214  < > 248 281  < > = values in this interval not displayed. Cardiac Enzymes: No results found for this basename: CKTOTAL, CKMB, CKMBINDEX, TROPONINI,  in the last 168 hours BNP: No components found with this basename: POCBNP,  CBG:  Recent Labs Lab 03/23/14 2134 03/24/14 0700  GLUCAP 239* 193*    Significant Diagnostic Studies:  Dg Chest Port 1 View  03/20/2014   CLINICAL DATA:  Central line placement  EXAM: PORTABLE CHEST - 1 VIEW  COMPARISON:  09/03/2009  FINDINGS: Left central venous catheter has been placed. The catheter tip overlies a superimposed EKG lead but appears to be in the mid the mediastinum consistent with location in the brachiocephalic vein. No pneumothorax. Shallow inspiration. Heart size and pulmonary vascularity are normal. Mild linear atelectasis in the lung bases. Similar appearance to previous study.  IMPRESSION: Left central venous catheter tip appears  to localize to the central brachiocephalic vein. Shallow inspiration with atelectasis in the lung bases.   Electronically Signed   By: Burman Nieves M.D.   On: 03/20/2014 02:42   Dg Foot Complete Right  03/19/2014   CLINICAL DATA:  Foot pain for 5 years, history diabetes  EXAM: RIGHT FOOT COMPLETE - 3+ VIEW  COMPARISON:  09/01/2009  FINDINGS: Dressing artifacts at great toe.  Remaining joint spaces preserved.  No acute fracture or dislocation.  Soft tissue swelling great toe with multiple foci of soft tissue gas consistent with soft tissue infection by gas-forming organism.  While some of the observed soft tissue gas is in close proximity to bones within the great toe, no definite bone destruction is seen to suggest osteomyelitis.  Old corticated erosion at the medial margin of the base of the proximal phalanx great toe question related to degenerative changes or an inflammatory arthropathy such as gout.  Diffuse osseous demineralization.  Advanced degenerative changes of first MTP joint.  Narrowing in destruction with bony debris at intertarsal joints compatible with a Charcot changes with midfoot collapse and pes planus.  Large plantar calcaneal spur.  IMPRESSION: Extensive soft tissue swelling in the great toe a region with multiple foci of soft tissue gas compatible with infection by a gas-forming organism.  No definite evidence of osteomyelitis though if this remains a clinical concern recommend MR imaging.  Advanced Charcot changes of the midfoot.   Electronically Signed   By: Ulyses Southward M.D.   On: 03/19/2014 18:13     Disposition and Follow-up:     Discharge Instructions   Diet Carb Modified    Complete by:  As directed      Discharge instructions    Complete by:  As directed   Wound care; please soak right foot in soapy water for 15-29mins, after drying, apply the bactroban ointment with dressing daily until you have followup appt with wound care center.  Please HOLD your BP meds until  your BP is more stable and running above >130/80 and start BP meds slowly.     Increase activity slowly    Complete by:  As directed  DISPOSITION: home   DIET: carb modified    DISCHARGE FOLLOW-UP Follow-up Information   Follow up with Kathryne Hitch, MD. Schedule an appointment as soon as possible for a visit in 2 weeks.   Specialty:  Orthopedic Surgery   Contact information:   [address] [place] [address] [phone]       Follow up with [facility]             . Schedule an appointment as soon as possible for a visit in 1 week. (for hospital follow-up)    Contact information:   [address] [place] [phone]      Time spent on Discharge: 40 mins  Signed:   RAI,RIPUDEEP M.D. [facility] 03/24/2014, 7:37 AM Pager: [phone]   **Disclaimer: This note was dictated with voice recognition software. Similar sounding words can inadvertently be transcribed and this note may contain transcription errors which may not have been corrected upon publication of note.**

## 2014-03-24 NOTE — Progress Notes (Signed)
Patient ID: Calvin Macias, male   DOB: [birth year], 53 y.o.   MRN: [id number] His right foot wound continues to slowly improve.  It will take a long time for healing, and he knows it still may worsen.  He needs outpatient follow-up at a wound treatment center and continued good diabetic control.  I spoke to him about daily wound care.  I believe that he can be discharged later today after hydrotherapy this am.

## 2014-03-24 NOTE — Progress Notes (Signed)
CARE MANAGEMENT NOTE 03/24/2014  Patient:  MICHOEL, KUNIN   Account Number:  [id number]  Date Initiated:  03/20/2014  Documentation initiated by:  [facility]  Subjective/Objective Assessment:   Admitted whith sepsis.  Great toe infection.     Action/Plan:   made appt with [facility]   Anticipated DC Date:  03/24/2014   Anticipated DC Plan:  HOME/SELF CARE      DC Planning Services  CM consult      Choice offered to / List presented to:             Status of service:  Completed, signed off Medicare Important Message given?   (If response is "NO", the following Medicare IM given date fields will be blank) Date Medicare IM given:   Medicare IM given by:   Date Additional Medicare IM given:   Additional Medicare IM given by:    Discharge Disposition:  HOME/SELF CARE  Per UR Regulation:  Reviewed for med. necessity/level of care/duration of stay  If discussed at Long Length of Stay Meetings, dates discussed:    Comments:  ContactDeontrey, Massi [phone]  03/24/14 Made appt with [facility] first available 04/14/14 at 10:15am. They will call patient if able to work patient in sooner. Informed Dr. Isidoro Donning and patient of appt time and date. Jacquelynn Cree RN, BSN, CCM

## 2014-03-24 NOTE — Progress Notes (Signed)
Calvin Macias to be D/C'd Home per MD order. Discussed with the patient and all questions fully answered.    Medication List    STOP taking these medications       benazepril 20 MG tablet  Commonly known as:  LOTENSIN     hydrochlorothiazide 12.5 MG capsule  Commonly known as:  MICROZIDE      TAKE these medications       amoxicillin-clavulanate 875-125 MG per tablet  Commonly known as:  AUGMENTIN  Take 1 tablet by mouth 2 (two) times daily. X 2 weeks     aspirin 81 MG tablet  Take 81 mg by mouth every morning.     doxycycline 100 MG capsule  Commonly known as:  VIBRAMYCIN  Take 1 capsule (100 mg total) by mouth 2 (two) times daily. X 2 weeks     glipiZIDE 10 MG tablet  Commonly known as:  GLUCOTROL  Take 10 tablets by mouth 2 (two) times daily.     INVOKANA 100 MG Tabs  Generic drug:  Canagliflozin  Take 100 mg by mouth daily.     MENS LIFE PACK Tabs  Take 1 each by mouth daily.     metFORMIN 1000 MG tablet  Commonly known as:  GLUCOPHAGE  Take 1,000 mg by mouth 2 (two) times daily.     mupirocin cream 2 %  Commonly known as:  BACTROBAN  Apply 1 application topically daily. Apply to the foot daily     pioglitazone 45 MG tablet  Commonly known as:  ACTOS  Take 45 mg by mouth daily.     simvastatin 40 MG tablet  Commonly known as:  ZOCOR  Take 40 mg by mouth daily.     VICTOZA 18 MG/3ML Sopn  Generic drug:  Liraglutide  Inject 1.8 mg into the skin daily.        VVS, Skin clean, dry and intact without evidence of skin break down, no evidence of skin tears noted.  IV catheter discontinued intact. Site without signs and symptoms of complications. Dressing and pressure applied.  An After Visit Summary was printed and given to the patient.  Patient escorted via WC, and D/C home via private auto.  [name]  03/24/2014 12:25 PM

## 2014-03-24 NOTE — Progress Notes (Signed)
Physical Therapy Wound Treatment Patient Details  Name: Calvin Macias MRN: [id number] Date of Birth: 04/17/1961  Today's Date: 03/24/2014 Time: 9562-1308 Time Calculation (min): 25 min  Subjective  Subjective: Pt reports he is going home today. Date of Onset:  (November 2014) Prior Treatments: dressing changes  Pain Score: Pain Score: Asleep  Wound Assessment  Wound / Incision (Open or Dehisced) 03/19/14 Foot Right (Active)  Dressing Type Gauze (Comment) 03/24/2014  9:21 AM  Dressing Changed Changed 03/24/2014  9:21 AM  Dressing Status Clean;Dry;Intact 03/24/2014  9:21 AM  Dressing Change Frequency PRN 03/21/2014  4:00 PM  Site / Wound Assessment Red;Pink;Yellow 03/24/2014  9:21 AM  % Wound base Red or Granulating 80% 03/24/2014  9:21 AM  % Wound base Yellow 20% 03/24/2014  9:21 AM  % Wound base Black 0% 03/24/2014  9:21 AM  % Wound base Other (Comment) 0% 03/24/2014  9:21 AM  Peri-wound Assessment Erythema (blanchable);Edema;Denuded 03/24/2014  9:21 AM  Wound Length (cm) 3 cm 03/21/2014  9:30 AM  Wound Width (cm) 1 cm 03/20/2014 10:40 AM  Undermining (cm) 3 to 4 cm dorsal surface of great toe due to gas pockets 03/20/2014 10:40 AM  Margins Unattached edges (unapproximated) 03/24/2014  9:21 AM  Closure None 03/24/2014  9:21 AM  Drainage Amount Minimal 03/24/2014  9:21 AM  Drainage Description No odor;Serous 03/24/2014  9:21 AM  Non-staged Wound Description Full thickness 03/24/2014  9:21 AM  Treatment Debridement (Selective);Hydrotherapy (Pulse lavage);Packing (Saline gauze) 03/24/2014  9:21 AM   Hydrotherapy Pulsed lavage therapy - wound location: R great toe communicating wound (s) Pulsed Lavage with Suction (psi): 8 psi Pulsed Lavage with Suction - Normal Saline Used: 1000 mL Pulsed Lavage Tip: Tunneling tip   Wound Assessment and Plan  Wound Therapy - Assess/Plan/Recommendations Wound Therapy - Clinical Statement: Pt with improving drainage and decr redness. Wound Therapy - Functional  Problem List: Will hinder ambulation at this point. Factors Delaying/Impairing Wound Healing: Diabetes Mellitus;Infection - systemic/local Hydrotherapy Plan: Debridement;Dressing change;Pulsatile lavage with suction;Patient/family education Wound Therapy - Frequency: 6X / week Wound Therapy - Follow Up Recommendations: [place] Wound Plan: Pt for dc home today.  Wound Therapy Goals- Improve the function of patient's integumentary system by progressing the wound(s) through the phases of wound healing (inflammation - proliferation - remodeling) by: Decrease Necrotic Tissue to: 20% visible Decrease Necrotic Tissue - Progress: Met Increase Granulation Tissue to: 75% visible Increase Granulation Tissue - Progress: Met Improve Drainage Characteristics: Min;Serous Improve Drainage Characteristics - Progress: Met  Goals will be updated until maximal potential achieved or discharge criteria met.  Discharge criteria: when goals achieved, discharge from hospital, MD decision/surgical intervention, no progress towards goals, refusal/missing three consecutive treatments without notification or medical reason.  GP     Oluchi Pucci 03/24/2014, 9:25 AM  Suanne Marker PT [phone]

## 2014-03-26 LAB — CULTURE, BLOOD (ROUTINE X 2)
CULTURE: NO GROWTH
Culture: NO GROWTH

## 2014-04-14 ENCOUNTER — Encounter (HOSPITAL_BASED_OUTPATIENT_CLINIC_OR_DEPARTMENT_OTHER): Payer: Managed Care, Other (non HMO) | Attending: Plastic Surgery

## 2014-04-14 DIAGNOSIS — E11621 Type 2 diabetes mellitus with foot ulcer: Secondary | ICD-10-CM | POA: Insufficient documentation

## 2014-04-14 DIAGNOSIS — L89892 Pressure ulcer of other site, stage 2: Secondary | ICD-10-CM | POA: Diagnosis not present

## 2014-04-15 NOTE — Progress Notes (Signed)
[facility]  NAME:  Calvin Macias, Calvin Macias                ACCOUNT NO.:  [id number]  MEDICAL RECORD NO.:  [id number]      DATE OF BIRTH:  [birth year]  PHYSICIAN:  Calvin Denislaire Sanger, DO       VISIT DATE:  04/14/2014                                  OFFICE VISIT   HISTORY OF PRESENT ILLNESS:  The patient is a 53 year old male who is here for evaluation of right foot diabetic chronic ulcers, plantar dorsal great toe and lateral.  He has been seen by Orthopedic Surgery and MRI was done, which did not show osteo, but there was some gas noted in the joint space.  He has been using wet to dries on the area.  PAST MEDICAL HISTORY: 1. Hypertension. 2. Diabetes type 2. 3. History of sepsis. 4. He has had an I and D of the right foot. 5. Acute kidney injury. 6. Hyperlipidemia.  ALLERGIES:  None.  MEDICATIONS:  Augmentin, aspirin, Invokana, Glucotrol, Victoza, Glucophage, Bactroban, Actos, and Zocor.  RECENT LABORATORY DATA:  Reviewed.  SOCIAL HISTORY:  Lives at home, has some family support.  REVIEW OF SYSTEMS:  Diabetes not well controlled.  PHYSICAL EXAMINATION:  GENERAL:  He is alert, oriented, and cooperative. He is pleasant and seems to be a good historian. HEENT:  Pupils are equal.  Extraocular muscles are intact. NECK:  He does not have any cervical lymphadenopathy. RESPIRATIONS:  His breathing is unlabored. CARDIOVASCULAR:  His heart rate is regular. ABDOMEN:  Soft.  Pulses are positive and equal bilaterally. EXTREMITIES:  He has some swelling of the right foot.  He has an ulcer on the dorsal aspect at the proximal portion of his great toe.  He has 1 on the plantar aspect of the great toe and 2 ulcers on the medial aspect of the great toe foot area.  These were debrided and those notes are noted in the chart.  RECOMMENDATION:  To continue with the antibiotic, elevation, multivitamin, vitamin C, Dial soap soaks, Santyl, diabetic control, Glucerna, and we will see  him back in a week.     Calvin Denislaire Sanger, DO     CS/MEDQ  D:  04/14/2014  T:  04/15/2014  Job:  [id number]

## 2014-04-21 DIAGNOSIS — E11621 Type 2 diabetes mellitus with foot ulcer: Secondary | ICD-10-CM | POA: Diagnosis not present

## 2014-04-21 NOTE — Progress Notes (Signed)
[facility]  NAME:  Lilyan GilfordBEAL, Juanita                ACCOUNT NO.:  [id number]  MEDICAL RECORD NO.:  [id number]      DATE OF BIRTH:  1961-04-03  PHYSICIAN:  Wayland Denislaire Sanger, DO       VISIT DATE:  04/21/2014                                  OFFICE VISIT   HISTORY:  The patient is a 53 year old male, who is here for a followup on his right great toe ulcers.  He has 1 plantar, dorsal, dorsal medial, and plantar medial.  He has been using Santyl on the areas with a little bit of improvement and he is actually pleased about with decreased swelling and redness.  He finished up his doxycycline this morning.  No other changes in social history or medications.  REVIEW OF SYSTEMS:  Negative.  PHYSICAL EXAMINATION:  GENERAL:  On exam, he is alert, oriented, cooperative, not in any distress.  He is very pleasant. HEENT:  Pupils are equal.  Extraocular muscles intact. RESPIRATORY:  Breathing is unlabored. HEART:  Rate is regular. SKIN:  The swelling has improved a little bit and the redness is improved a little.  I reviewed again the x-ray from a month ago, not indicating osteo. Today after debridement, it was clear that the medial ulcers are to bone.  This is concerning.  I will keep a close eye on this.  The pulses are present.  There is a little improvement in the dorsal ulcer with granulation tissue.  I have expressed my concern for the need in the near future if this does not improve for amputation and he acknowledges understanding that.  I am going to refill his doxycycline, add collagen to the dorsal wound.  We will continue with Santyl on the other wounds. I will see him back in a week.  He can shower between dressing changes.     Wayland Denislaire Sanger, DO     CS/MEDQ  D:  04/21/2014  T:  04/21/2014  Job:  [id number]

## 2014-04-28 DIAGNOSIS — E11621 Type 2 diabetes mellitus with foot ulcer: Secondary | ICD-10-CM | POA: Diagnosis not present

## 2014-04-28 NOTE — Progress Notes (Signed)
[facility]  NAME:  Calvin Macias, Calvin Macias                ACCOUNT NO.:  [id number]  MEDICAL RECORD NO.:  [id number]      DATE OF BIRTH:  1961-01-11  PHYSICIAN:  Wayland Denislaire Sanger, DO       VISIT DATE:  04/28/2014                                  OFFICE VISIT   HISTORY OF PRESENT ILLNESS:  The patient is a 53 year old male who is here for a followup on his right diabetic foot ulcers, several of them are noted in the chart.  He has been using collagen on the dorsal aspect, Santyl on the other wounds; and we started __________ seems to have made a very big difference.  There is decreased swelling, redness, and improvement in all the wounds.  The bottom of the foot is nearly healed, the plantar area.  He does have an appointment with Dr. Magnus IvanBlackman as well.  There is no change in his social history.  REVIEW OF SYSTEMS:  Negative.  PHYSICAL EXAMINATION:  GENERAL:  On exam, he is alert, oriented, cooperative, not in any distress.  He is very pleasant. LUNGS:  His breathing is unlabored. HEART:  His heart rate is regular.  ASSESSMENT AND PLAN:  The wounds are as described above.  We will continue with the collagen on the dorsal aspect, Santyl on the toe, and triple antibiotic on the plantar aspect.  Continue with the doxycycline, elevation, multivitamin, vitamin C, zinc, blood sugar control; and we will check again to see what his pre-albumin was, today.  It was ordered 2 weeks ago.     Wayland Denislaire Sanger, DO     CS/MEDQ  D:  04/28/2014  T:  04/28/2014  Job:  [id number]

## 2014-05-12 ENCOUNTER — Encounter (HOSPITAL_BASED_OUTPATIENT_CLINIC_OR_DEPARTMENT_OTHER): Payer: Managed Care, Other (non HMO) | Attending: Plastic Surgery

## 2014-05-12 DIAGNOSIS — L97511 Non-pressure chronic ulcer of other part of right foot limited to breakdown of skin: Secondary | ICD-10-CM | POA: Insufficient documentation

## 2014-05-12 DIAGNOSIS — E11621 Type 2 diabetes mellitus with foot ulcer: Secondary | ICD-10-CM | POA: Diagnosis present

## 2014-05-15 NOTE — Progress Notes (Signed)
[facility]  NAME:  Calvin Macias, Calvin Macias                     ACCOUNT NO.:  MEDICAL RECORD NO.:  [id number]      DATE OF BIRTH:  11-08-1960  PHYSICIAN:  Wayland Denislaire Sanger, DO            VISIT DATE:                                  OFFICE VISIT   The patient is a 53 year old male who is here for followup on his right lower extremity multiple ulcers.  The one on the plantar area, we were doing a combination of collagen, Santyl, and triple antibiotic.  The plantar area has healed, the lateral aspect of the great toe seems to has healed and then the proximal dorsal aspect is granulating and epithelializing, so this is very encouraging.  There is no change in his medications.  He just finished the doxycycline.  His blood sugars are moderately controlled.  There is no social history change.  He is alert and oriented, cooperative, not in any distress.  He is very pleasant. His breathing is unlabored.  His heart rate is regular.  He is in very good spirits.  The wounds have been described above and there is a marked improvement.  There is still a little bit of redness, so we will continue with the doxycycline and collagen elevation, protein, multivitamins, and plan to see him back in a week.     Wayland Denislaire Sanger, DO     CS/MEDQ  D:  05/12/2014  T:  05/13/2014  Job:  [id number]

## 2014-05-19 DIAGNOSIS — E11621 Type 2 diabetes mellitus with foot ulcer: Secondary | ICD-10-CM | POA: Diagnosis not present

## 2014-05-19 NOTE — Progress Notes (Signed)
[facility]  NAME:  Calvin Macias, Calvin Macias                     ACCOUNT NO.:  MEDICAL RECORD NO.:  [id number]      DATE OF BIRTH:  26-Nov-1960  PHYSICIAN:  Wayland Denislaire Sanger, DO       VISIT DATE:  05/19/2014                                  OFFICE VISIT   The patient is a 53 year old male, who is here for a followup on his right diabetic foot ulcer Wagner 2. He has been using collagen on the area and taking antibiotics.  REVIEW OF SYSTEMS:  Negative.  No change in medications or social history.  On exam, he is alert and oriented, cooperative, very pleasant.  His glasses are on.  No cervical lymphadenopathy.  His breathing is unlabored.  His heart rate is regular and his pulse is present in that foot and is warm.  He has marked improvement with a decrease in the redness and swelling, the best I have seen so far.  The bottom and medial lateral aspect wounds have healed and the dorsal aspect is smaller and doing much better.  Based on that, we are going to do triple antibiotic to all other areas where there is still a little bit of a scab and the dorsal part we will use Endoform and he can change that daily and we will see him back in 1 week.  He is to continue with the protein and the vitamins as well, elevation and wearing socks to his knees.     Wayland Denislaire Sanger, DO     CS/MEDQ  D:  05/19/2014  T:  05/19/2014  Job:  [id number]

## 2014-05-26 DIAGNOSIS — E11621 Type 2 diabetes mellitus with foot ulcer: Secondary | ICD-10-CM | POA: Diagnosis not present

## 2014-05-27 NOTE — Progress Notes (Signed)
[facility]  NAME:  Calvin Macias, Calvin Macias                     ACCOUNT NO.:  MEDICAL RECORD NO.:  [id number]      DATE OF BIRTH:  04-24-1961  PHYSICIAN:  Wayland Denislaire Sanger, DO       VISIT DATE:  05/26/2014                                  OFFICE VISIT   The patient is a 53 year old gentleman who is here for a followup on his right great toe diabetic foot ulcer.  He has been using Endoform and doing his own dressings and overall he looks really good.  The redness has resolved, the swelling has improved, and the wound is smaller than it was last week.  There is no change in his medications or social history.  On exam, he is alert, oriented, cooperative, not in any distress.  He is pleasant.  Pupils equal.  Breathing unlabored.  Heart rate is regular. No sign of ongoing infection  Another Endoform was placed. We encouraged to continue elevation ,multivitamin, vitamin C, zinc, protein, and blood sugar control and we will see him next week.     Wayland Denislaire Sanger, DO     CS/MEDQ  D:  05/26/2014  T:  05/27/2014  Job:  [id number]

## 2014-06-02 DIAGNOSIS — E11621 Type 2 diabetes mellitus with foot ulcer: Secondary | ICD-10-CM | POA: Diagnosis not present

## 2014-06-06 NOTE — Progress Notes (Signed)
[facility]  NAME:  Calvin Macias, Calvin Macias                     ACCOUNT NO.:  MEDICAL RECORD NO.:  [id number]      DATE OF BIRTH:  Jan 26, 1961  PHYSICIAN:  Wayland Denislaire Sanger, DO       VISIT DATE:  06/02/2014                                  OFFICE VISIT   HISTORY OF PRESENT ILLNESS:  The patient is a 53 year old man here for followup in his right foot diabetic foot ulcer Wagner 2.  He is doing extremely well with marked improvement in the overall condition. Unfortunately, there was an accident at work and large beam fell on this foot with quite a bit of bruising and swelling of the foot.  Otherwise, there are no change in his medications or social history.  PHYSICAL EXAMINATION:  GENERAL:  He is alert, oriented, and cooperative, not in any distress.  He is very pleasant. EYES:  Pupils are equal. LUNGS:  Breathing is unlabored. HEART:  Heart rate is regular.  The wound is markedly improved as stated above.  There is quite a bit of bruising on his foot due to the injury.  Continue with the Endoform, elevation, multivitamin, vitamin C, zinc, and follow up in 1 week.     Wayland Denislaire Sanger, DO     CS/MEDQ  D:  06/02/2014  T:  06/03/2014  Job:  [id number]

## 2014-06-16 ENCOUNTER — Encounter (HOSPITAL_BASED_OUTPATIENT_CLINIC_OR_DEPARTMENT_OTHER): Payer: Managed Care, Other (non HMO) | Attending: Plastic Surgery

## 2014-06-16 DIAGNOSIS — L97519 Non-pressure chronic ulcer of other part of right foot with unspecified severity: Secondary | ICD-10-CM | POA: Insufficient documentation

## 2014-06-16 DIAGNOSIS — E11621 Type 2 diabetes mellitus with foot ulcer: Secondary | ICD-10-CM | POA: Diagnosis present

## 2014-06-16 NOTE — Progress Notes (Signed)
[facility]  NAME:  Calvin Macias, Calvin Macias                ACCOUNT NO.:  [id number]  MEDICAL RECORD NO.:  [id number]      DATE OF BIRTH:  11/24/1960  PHYSICIAN:  Wayland Denislaire Sanger, DO       VISIT DATE:  06/16/2014                                  OFFICE VISIT   The patient is a 53 year old male who is here for followup on his right great toe diabetic foot ulcer.  He is doing extremely well and has actually completely healed.  There has been no change in his medication or social history.  He is alert, oriented, not in any distress.  Pulse is present.  All areas healed.  Swelling has resolved as well as the redness.  So, we will plan on being available if he should need us, but he is healed.     Wayland Denislaire Sanger, DO     CS/MEDQ  D:  06/16/2014  T:  06/16/2014  Job:  [id number]

## 2016-08-31 ENCOUNTER — Other Ambulatory Visit: Payer: Self-pay | Admitting: Nephrology

## 2016-08-31 DIAGNOSIS — N183 Chronic kidney disease, stage 3 unspecified: Secondary | ICD-10-CM

## 2016-08-31 DIAGNOSIS — I129 Hypertensive chronic kidney disease with stage 1 through stage 4 chronic kidney disease, or unspecified chronic kidney disease: Secondary | ICD-10-CM

## 2016-10-07 ENCOUNTER — Ambulatory Visit
Admission: RE | Admit: 2016-10-07 | Discharge: 2016-10-07 | Disposition: A | Discharge status: Home / Self Care | Payer: Managed Care, Other (non HMO) | Source: Ambulatory Visit | Attending: Nephrology | Admitting: Nephrology

## 2016-10-07 DIAGNOSIS — N183 Chronic kidney disease, stage 3 unspecified: Secondary | ICD-10-CM

## 2016-10-07 DIAGNOSIS — I129 Hypertensive chronic kidney disease with stage 1 through stage 4 chronic kidney disease, or unspecified chronic kidney disease: Secondary | ICD-10-CM

## 2017-07-31 ENCOUNTER — Ambulatory Visit: Payer: Managed Care, Other (non HMO) | Admitting: Podiatry

## 2017-07-31 ENCOUNTER — Ambulatory Visit (INDEPENDENT_AMBULATORY_CARE_PROVIDER_SITE_OTHER): Payer: Managed Care, Other (non HMO)

## 2017-07-31 ENCOUNTER — Encounter: Payer: Self-pay | Admitting: Podiatry

## 2017-07-31 VITALS — BP 153/84 | HR 77

## 2017-07-31 DIAGNOSIS — L97511 Non-pressure chronic ulcer of other part of right foot limited to breakdown of skin: Secondary | ICD-10-CM

## 2017-07-31 DIAGNOSIS — I70235 Atherosclerosis of native arteries of right leg with ulceration of other part of foot: Secondary | ICD-10-CM

## 2017-07-31 DIAGNOSIS — E0843 Diabetes mellitus due to underlying condition with diabetic autonomic (poly)neuropathy: Secondary | ICD-10-CM

## 2017-07-31 DIAGNOSIS — L97512 Non-pressure chronic ulcer of other part of right foot with fat layer exposed: Secondary | ICD-10-CM | POA: Diagnosis not present

## 2017-07-31 NOTE — Progress Notes (Signed)
   Subjective:    Patient ID: Rosanne GuttingCharles D Walpole, male    DOB: [birth year], 57 y.o.   MRN: [id number]  HPI    Review of Systems  All other systems reviewed and are negative.      Objective:   Physical Exam        Assessment & Plan:

## 2017-08-01 ENCOUNTER — Telehealth: Payer: Self-pay | Admitting: *Deleted

## 2017-08-01 NOTE — Telephone Encounter (Signed)
Required paper work complete with as much information as was given, request of LOV sent to Dr. Logan BoresEvans.

## 2017-08-01 NOTE — Telephone Encounter (Signed)
-----   Message from Felecia ShellingBrent M Evans, DPM sent at 07/31/2017 12:32 PM EST ----- Regarding: Authorization for EpiFix Can we see if we can get this patient approved for EpiFix.  Diagnosis: Diabetic foot ulcer right > 2077yr  Thanks, Dr. Logan BoresEvans

## 2017-08-04 NOTE — Telephone Encounter (Signed)
Faxed required form, clinical 07/31/2017 and demographics to [facility].

## 2017-08-04 NOTE — Progress Notes (Signed)
Patient ID: Calvin GuttingCharles D Macias, male   DOB: [birth year], 57 y.o.   MRN: [id number]   Subjective:  Patient with a history of diabetes mellitus presents today for evaluation ulceration(s) to the lower extremitie(s). Patient states that he did have a history of diabetes mellitus.  Patient states that the ulceration has been present for greater than 1 year.  Patient is a long-standing ulcer which is present under the ball of his right foot under the great toe with a gradual onset.  Patient states that the ulcer never got worse and has just stayed the same for the past 12 months.  He does have some oozing and drainage to the ulceration site.  Patient has been treating it with over-the-counter antibiotic cream and clean dressings.  He presents today for further treatment and evaluation Patient presents today for further treatment and evaluation   Objective/Physical Exam General: The patient is alert and oriented x3 in no acute distress.  Dermatology:  Wound #1 noted to the plantar aspect of the first MTPJ right foot measuring approximately 1.0 x 0.6 x 0.2 cm (LxWxD).   To the noted ulceration(s), there is no eschar. There is a moderate amount of slough, fibrin, and necrotic tissue noted. Granulation tissue and wound base is red. There is a minimal amount of serosanguineous drainage noted. There is no exposed bone muscle-tendon ligament or joint. There is no malodor. Periwound integrity is intact. Skin is warm, dry and supple bilateral lower extremities.  Vascular: Palpable pedal pulses bilaterally. No edema or erythema noted. Capillary refill within normal limits.  Neurological: Epicritic and protective threshold absent bilaterally.   Musculoskeletal Exam: Range of motion within normal limits to all pedal and ankle joints bilateral. Muscle strength 5/5 in all groups bilateral.   Assessment: #1  Ulcer sub-first MPJ right foot secondary to diabetes mellitus #2 diabetes mellitus w/ peripheral  neuropathy   Plan of Care:  #1 Patient was evaluated. #2 medically necessary excisional debridement including subcutaneous tissue was performed using a tissue nipper and a chisel blade. Excisional debridement of all the necrotic nonviable tissue down to healthy bleeding viable tissue was performed with post-debridement measurements same as pre-. #3 the wound was cleansed and dry sterile dressing applied. #4  Recommend that the patient apply Santyl ointment daily and use offloading callus pads which were dispensed.  Patient currently has a prescription for the Santyl ointment. #5 continue wearing diabetic shoes #6 today we will request authorization for EpiFix skin graft.  Please note this graft will be medically necessary.  The patient has failed all conservative measurements of treatment and satisfactory alleviation of symptoms for the patient.  The patient is also had the wound for greater than 6 weeks. #7 return to clinic in 3 weeks for graft application.   Felecia ShellingBrent M. Darrien Laakso, DPM [facility]  Dr. Felecia ShellingBrent M. Morning Halberg, DPM    [address]                                        [place], [place] [address]                Office [phone]  Fax [phone]

## 2017-08-04 NOTE — Telephone Encounter (Deleted)
-----   Message from Brent M Evans, DPM sent at 07/31/2017 12:32 PM EST ----- Regarding: Authorization for EpiFix Can we see if we can get this patient approved for EpiFix.  Diagnosis: Diabetic foot ulcer right > 1yr  Thanks, Dr. Evans 

## 2017-08-04 NOTE — Telephone Encounter (Signed)
LOV 07/31/17 is completed. Thanks Joya SanValery! Have a good weekend! Dr. Logan BoresEvans

## 2017-08-04 NOTE — Telephone Encounter (Signed)
Request clinicals 07/31/2017 from Dr. Logan BoresEvans.

## 2017-08-07 ENCOUNTER — Telehealth: Payer: Self-pay | Admitting: *Deleted

## 2017-08-07 NOTE — Telephone Encounter (Signed)
-----   Message from Brent M Evans, DPM sent at 07/31/2017 12:32 PM EST ----- Regarding: Authorization for EpiFix Can we see if we can get this patient approved for EpiFix.  Diagnosis: Diabetic foot ulcer right > 1yr  Thanks, Dr. Evans 

## 2017-08-07 NOTE — Telephone Encounter (Signed)
[facility] required form[facility] clinical and demographics [facility].

## 2017-08-21 ENCOUNTER — Ambulatory Visit: Payer: Managed Care, Other (non HMO) | Admitting: Podiatry

## 2017-08-21 ENCOUNTER — Encounter: Payer: Self-pay | Admitting: Podiatry

## 2017-08-21 DIAGNOSIS — E0843 Diabetes mellitus due to underlying condition with diabetic autonomic (poly)neuropathy: Secondary | ICD-10-CM

## 2017-08-21 DIAGNOSIS — I70235 Atherosclerosis of native arteries of right leg with ulceration of other part of foot: Secondary | ICD-10-CM

## 2017-08-21 DIAGNOSIS — L97512 Non-pressure chronic ulcer of other part of right foot with fat layer exposed: Secondary | ICD-10-CM | POA: Diagnosis not present

## 2017-08-22 NOTE — Progress Notes (Signed)
Patient ID: Calvin Macias, male   DOB: 13-Apr-1961, 57 y.o.   MRN: [id number]   Subjective:  Patient with a history of diabetes mellitus presents today for follow up evaluation of an ulceration to the sub-first MPJ of the right foot. He states the area looks as if it has improved some. He has been using Santyl ointment and wearing the offloading pads as directed. He denies any new complaints at this time. He has not met the criteria for a wound graft. Patient is here for further evaluation and treatment.    Past Medical History:  Diagnosis Date  . Diabetes mellitus, type 2 ([place])   . Diabetic neuropathy ([place])   . Hyperlipidemia   . Hypertension      Objective/Physical Exam General: The patient is alert and oriented x3 in no acute distress.  Dermatology:  Wound #1 noted to the plantar aspect of the first MTPJ right foot measuring approximately 0.5 x 0.4 x 0.1 cm (LxWxD).   To the noted ulceration(s), there is no eschar. There is a moderate amount of slough, fibrin, and necrotic tissue noted. Granulation tissue and wound base is red. There is a minimal amount of serosanguineous drainage noted. There is no exposed bone muscle-tendon ligament or joint. There is no malodor. Periwound integrity is intact. Skin is warm, dry and supple bilateral lower extremities.  Vascular: Palpable pedal pulses bilaterally. No edema or erythema noted. Capillary refill within normal limits.  Neurological: Epicritic and protective threshold absent bilaterally.   Musculoskeletal Exam: Range of motion within normal limits to all pedal and ankle joints bilateral. Muscle strength 5/5 in all groups bilateral.   Assessment: #1  Ulcer sub-first MPJ right foot secondary to diabetes mellitus - improved #2 diabetes mellitus w/ peripheral neuropathy   Plan of Care:  #1 Patient was evaluated. #2 medically necessary excisional debridement including subcutaneous tissue was performed using a tissue nipper and a chisel  blade. Excisional debridement of all the necrotic nonviable tissue down to healthy bleeding viable tissue was performed with post-debridement measurements same as pre-. #3 the wound was cleansed and dry sterile dressing applied. #4 Continue using Santyl ointment and wearing offloading pads. #5 Continue wearing DM shoes.  #6 Return to clinic in 3 weeks.    Edrick Kins, DPM [facility]  Dr. Edrick Kins, [address]                                        [place], [place] [address]                Office [phone]  Fax [phone]

## 2017-09-11 ENCOUNTER — Ambulatory Visit: Payer: Managed Care, Other (non HMO) | Admitting: Podiatry

## 2017-09-11 DIAGNOSIS — E0843 Diabetes mellitus due to underlying condition with diabetic autonomic (poly)neuropathy: Secondary | ICD-10-CM

## 2017-09-11 DIAGNOSIS — L97512 Non-pressure chronic ulcer of other part of right foot with fat layer exposed: Secondary | ICD-10-CM

## 2017-09-11 DIAGNOSIS — I70235 Atherosclerosis of native arteries of right leg with ulceration of other part of foot: Secondary | ICD-10-CM

## 2017-09-12 NOTE — Progress Notes (Signed)
   Subjective: 57 year old male presenting today for follow up evaluation of an ulceration to the sub-first MPJ of the right foot. He states the area is improving significantly. He has been applying Santyl ointment as directed. He denies any drainage. Patient is here for further evaluation and treatment.   Past Medical History:  Diagnosis Date  . Diabetes mellitus, type 2 ([facility])   . Diabetic neuropathy ([facility])   . Hyperlipidemia   . Hypertension     Objective:  Physical Exam General: Alert and oriented x3 in no acute distress  Dermatology: Hyperkeratotic lesion present on the right sub-first MPJ. Pain on palpation with a central nucleated core noted.  Skin is warm, dry and supple bilateral lower extremities. Negative for open lesions or macerations.  Vascular: Palpable pedal pulses bilaterally. No edema or erythema noted. Capillary refill within normal limits.  Neurological: Epicritic and protective threshold diminished bilaterally.   Musculoskeletal Exam: Pain on palpation at the keratotic lesion noted. Range of motion within normal limits bilateral. Muscle strength 5/5 in all groups bilateral.  Assessment: #1 Diabetes mellitus w/ peripheral neuropathy #2 Pre-ulcerative callus lesion sub-first MPJ right   Plan of Care:  #1 Patient evaluated. #2 Excisional debridement of keratotic lesion using a chisel blade was performed without incident.  #3 Dressed area with light dressing. #4 Continue using Santyl for one week.  #5 Continue using Gold Bond diabetic foot lotion.  #6 Continue wearing New Balance sneakers.  #7 Patient is to return to the clinic PRN.    Felecia ShellingBrent M. Evans, DPM [facility]  Dr. Felecia ShellingBrent M. Evans, DPM    [address]                                        [place], [place] [address]                Office [phone]  Fax [phone]

## 2019-04-26 ENCOUNTER — Ambulatory Visit
Admission: RE | Admit: 2019-04-26 | Discharge: 2019-04-26 | Disposition: A | Discharge status: Home / Self Care | Payer: Managed Care, Other (non HMO) | Source: Ambulatory Visit | Attending: Internal Medicine | Admitting: Internal Medicine

## 2019-04-26 ENCOUNTER — Other Ambulatory Visit: Payer: Self-pay | Admitting: Internal Medicine

## 2019-04-26 DIAGNOSIS — M1990 Unspecified osteoarthritis, unspecified site: Secondary | ICD-10-CM

## 2019-10-04 ENCOUNTER — Ambulatory Visit: Payer: Managed Care, Other (non HMO) | Attending: Internal Medicine

## 2019-10-04 DIAGNOSIS — Z23 Encounter for immunization: Secondary | ICD-10-CM

## 2019-10-04 NOTE — Progress Notes (Signed)
   Covid-19 Vaccination Clinic  Name:  Calvin Macias    MRN: [id number] DOB: 01/17/1961  10/04/2019  Mr. Calvin Macias was observed post Covid-19 immunization for 15 minutes without incident. He was provided with Vaccine Information Sheet and instruction to access the V-Safe system.   Mr. Calvin Macias was instructed to call 911 with any severe reactions post vaccine: Marland Kitchen Difficulty breathing  . Swelling of face and throat  . A fast heartbeat  . A bad rash all over body  . Dizziness and weakness   Immunizations Administered    Name Date Dose VIS Date Route   Pfizer COVID-19 Vaccine 10/04/2019  2:44 PM 0.3 mL 06/21/2019 Intramuscular   Manufacturer: [employer], [employer]   Lot: [id number]   NDC: [address]-9

## 2019-10-30 ENCOUNTER — Ambulatory Visit: Payer: Managed Care, Other (non HMO) | Attending: Internal Medicine

## 2019-10-30 DIAGNOSIS — Z23 Encounter for immunization: Secondary | ICD-10-CM

## 2019-10-30 NOTE — Progress Notes (Signed)
   Covid-19 Vaccination Clinic  Name:  ADOLPH CLUTTER    MRN: [id number] DOB: 02-02-1961  10/30/2019  Mr. Holderness was observed post Covid-19 immunization for 15 minutes without incident. He was provided with Vaccine Information Sheet and instruction to access the V-Safe system.   Mr. Jeanbaptiste was instructed to call 911 with any severe reactions post vaccine: Marland Kitchen Difficulty breathing  . Swelling of face and throat  . A fast heartbeat  . A bad rash all over body  . Dizziness and weakness   Immunizations Administered    Name Date Dose VIS Date Route   Pfizer COVID-19 Vaccine 10/30/2019  2:37 PM 0.3 mL 09/04/2018 Intramuscular   Manufacturer: [employer], [employer]   Lot: [id number]   NDC: [address]-2

## 2019-12-27 ENCOUNTER — Other Ambulatory Visit: Payer: Self-pay | Admitting: Family Medicine

## 2019-12-27 DIAGNOSIS — I6529 Occlusion and stenosis of unspecified carotid artery: Secondary | ICD-10-CM

## 2020-01-07 ENCOUNTER — Ambulatory Visit
Admission: RE | Admit: 2020-01-07 | Discharge: 2020-01-07 | Disposition: A | Discharge status: Home / Self Care | Payer: Managed Care, Other (non HMO) | Source: Ambulatory Visit | Attending: Family Medicine | Admitting: Family Medicine

## 2020-01-07 DIAGNOSIS — I6529 Occlusion and stenosis of unspecified carotid artery: Secondary | ICD-10-CM

## 2020-08-31 ENCOUNTER — Ambulatory Visit: Payer: Managed Care, Other (non HMO) | Admitting: Podiatry

## 2020-08-31 ENCOUNTER — Other Ambulatory Visit: Payer: Self-pay

## 2020-08-31 DIAGNOSIS — L989 Disorder of the skin and subcutaneous tissue, unspecified: Secondary | ICD-10-CM

## 2020-08-31 DIAGNOSIS — E0843 Diabetes mellitus due to underlying condition with diabetic autonomic (poly)neuropathy: Secondary | ICD-10-CM

## 2020-08-31 DIAGNOSIS — B351 Tinea unguium: Secondary | ICD-10-CM | POA: Diagnosis not present

## 2020-08-31 DIAGNOSIS — M79674 Pain in right toe(s): Secondary | ICD-10-CM

## 2020-08-31 DIAGNOSIS — M79675 Pain in left toe(s): Secondary | ICD-10-CM

## 2020-08-31 NOTE — Progress Notes (Signed)
   SUBJECTIVE Patient with a history of diabetes mellitus presents to office today complaining of elongated, thickened nails that cause pain while ambulating in shoes.  He is unable to trim his own nails. Patient is here for further evaluation and treatment.   Past Medical History:  Diagnosis Date  . Diabetes mellitus, type 2 (HCC)   . Diabetic neuropathy ([facility])   . Hyperlipidemia   . Hypertension     OBJECTIVE General Patient is awake, alert, and oriented x 3 and in no acute distress. Derm Skin is dry and supple bilateral. Negative open lesions or macerations. Remaining integument unremarkable. Nails are tender, long, thickened and dystrophic with subungual debris, consistent with onychomycosis, 1-5 bilateral. No signs of infection noted.  Hyperkeratotic preulcerative callus noted to the plantar aspect of the right hallux Vasc  DP and PT pedal pulses palpable bilaterally. Temperature gradient within normal limits.  Neuro Epicritic and protective threshold sensation diminished bilaterally.  Musculoskeletal Exam No symptomatic pedal deformities noted bilateral. Muscular strength within normal limits.  ASSESSMENT 1. Diabetes Mellitus w/ peripheral neuropathy 2. Onychomycosis of nail due to dermatophyte bilateral 3. Pain in foot bilateral 4.  Preulcerative callus lesion  PLAN OF CARE 1. Patient evaluated today. 2. Instructed to maintain good pedal hygiene and foot care. Stressed importance of controlling blood sugar.  3. Mechanical debridement of nails 1-5 bilaterally performed using a nail nipper. Filed with dremel without incident.  4.  Excisional debridement of the hyperkeratotic preulcerative callus tissue was performed to the plantar aspect of the right hallux  5.  Return to clinic in 3 mos.     Felecia Shelling, DPM [facility]  Dr. Felecia Shelling, DPM    [address]                                        [place], [place] [address]                Office  [phone]  Fax [phone]

## 2021-04-16 IMAGING — US US CAROTID DUPLEX BILAT
1 series · 13 of 24 positions shown · non-contrast
Comparison: None.

CLINICAL DATA: 59-year-old male with a history of carotid stenosis

EXAM:
BILATERAL CAROTID DUPLEX ULTRASOUND
TECHNIQUE: Gray scale imaging, color Doppler and duplex ultrasound were
performed of bilateral carotid and vertebral arteries in the neck.

[Series 1: us carotid duplex bilat · 0.06mm/px · 13 of 73 slices shown]
[im 1/73]
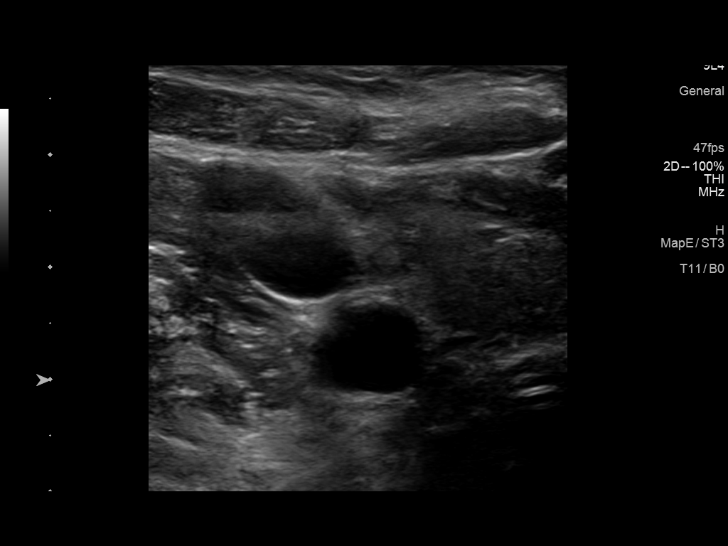
[im 7/73]
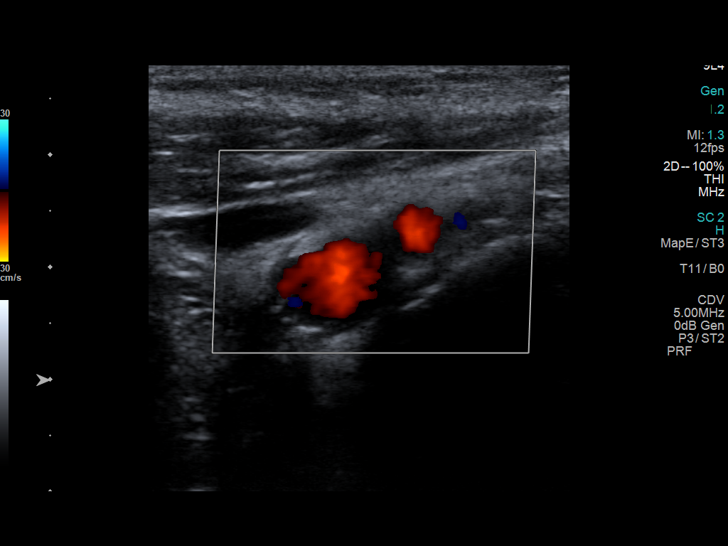
[im 13/73]
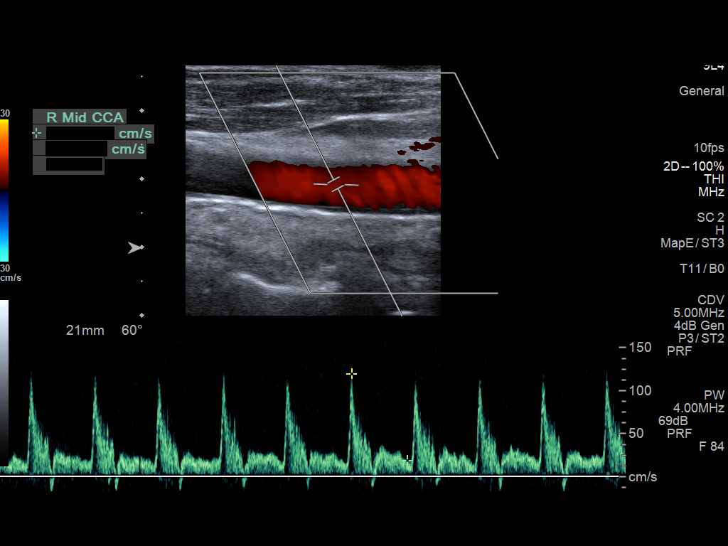
[im 19/73]
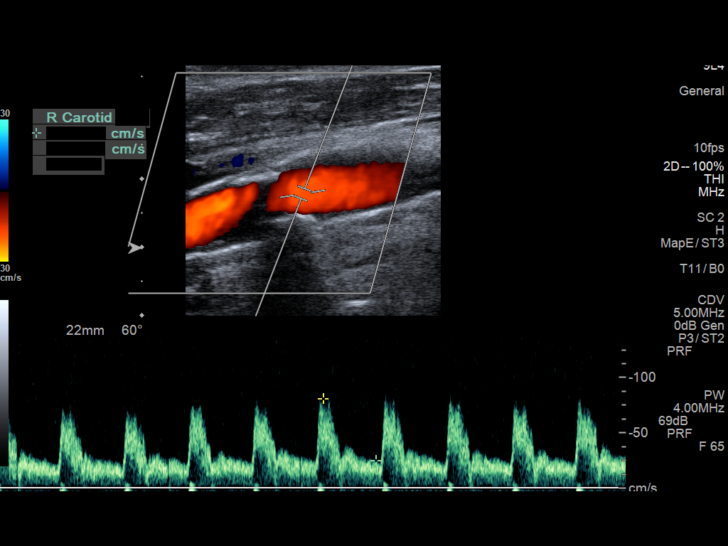
[im 26/73]
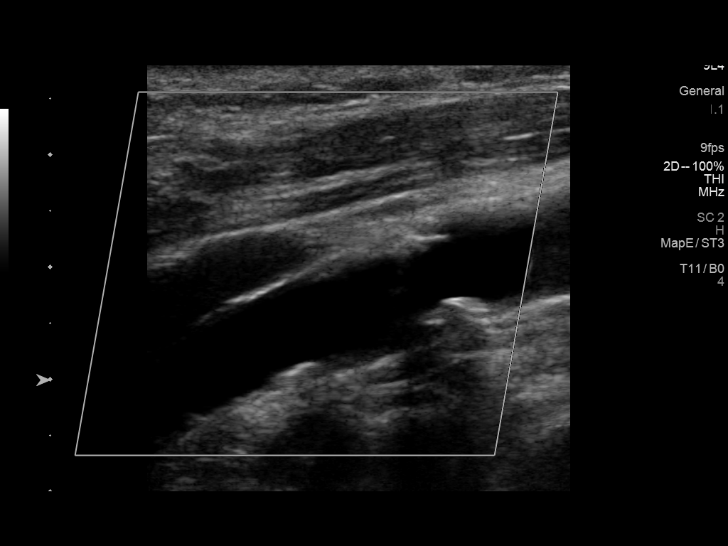
[im 32/73]
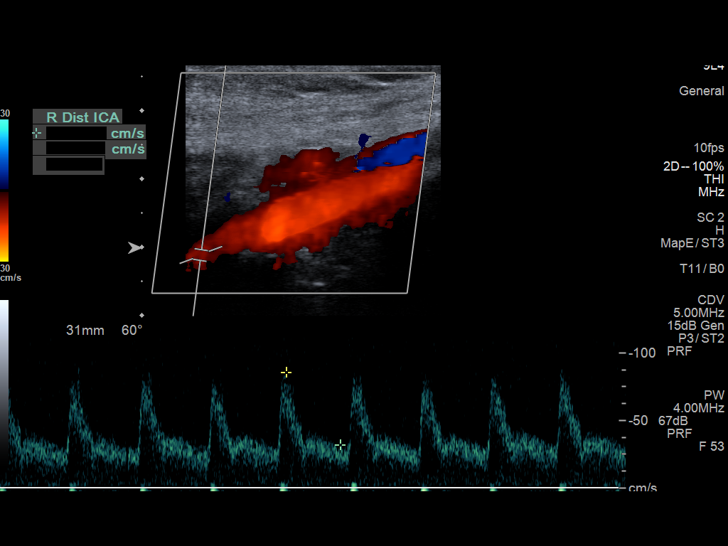
[im 38/73]
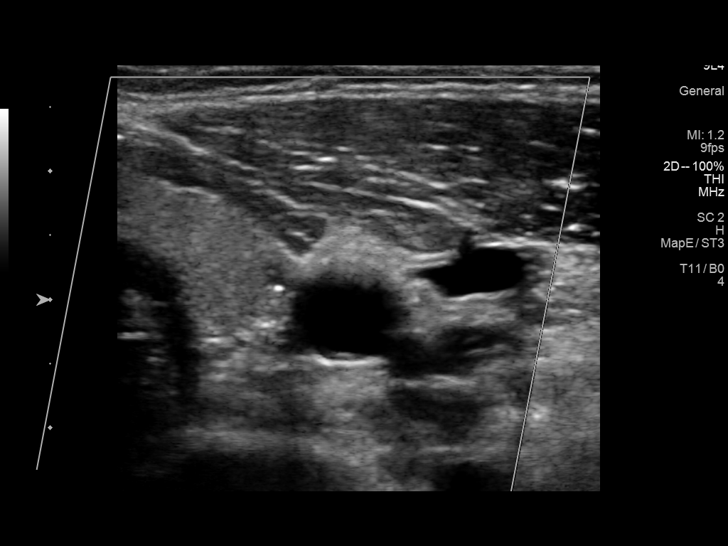
[im 41/73]
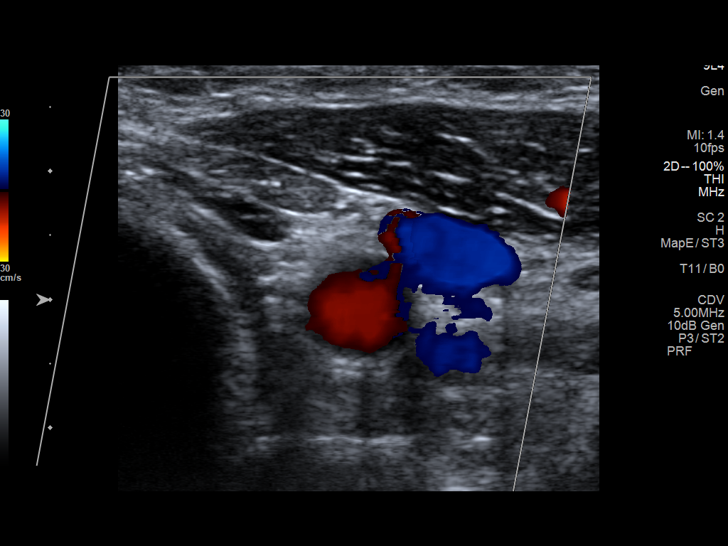
[im 47/73]
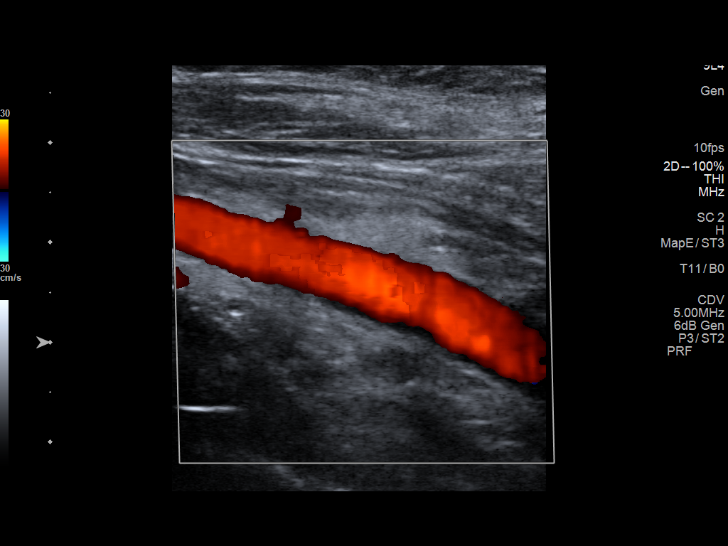
[im 54/73]
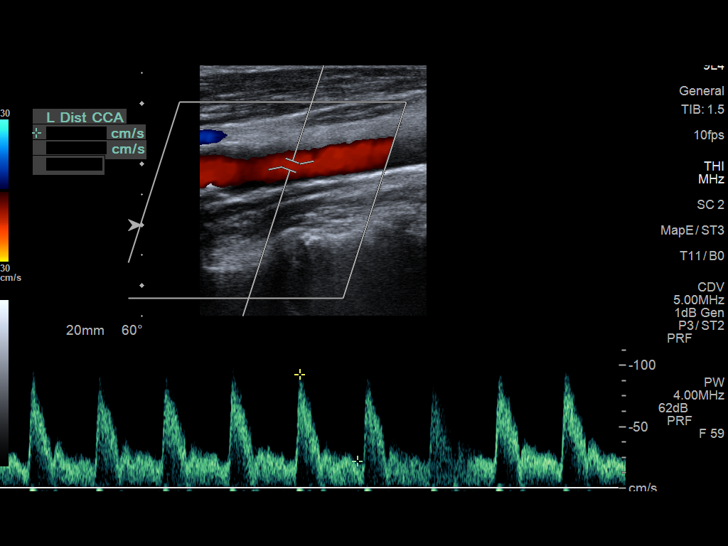
[im 60/73]
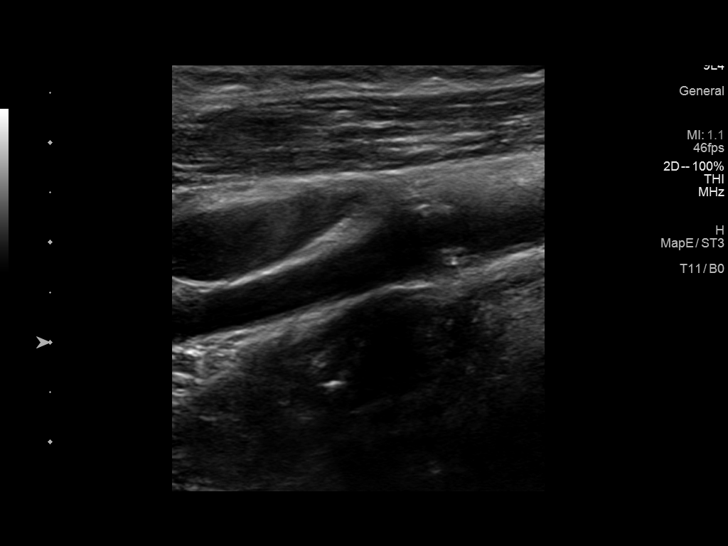
[im 66/73]
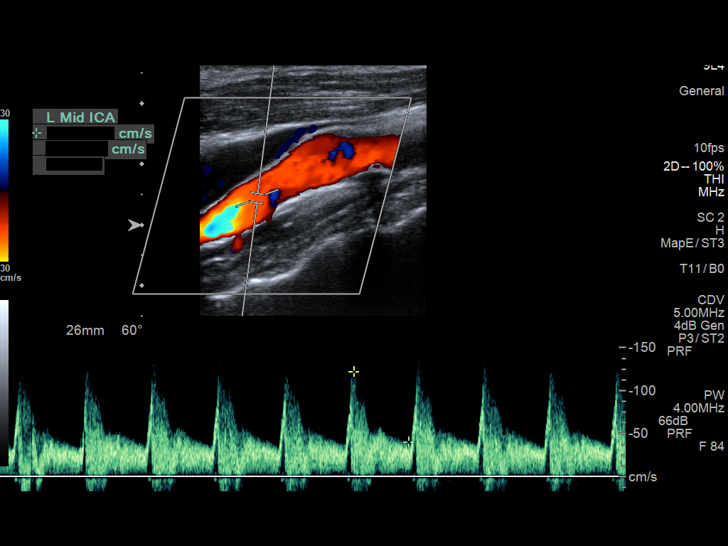
[im 73/73]
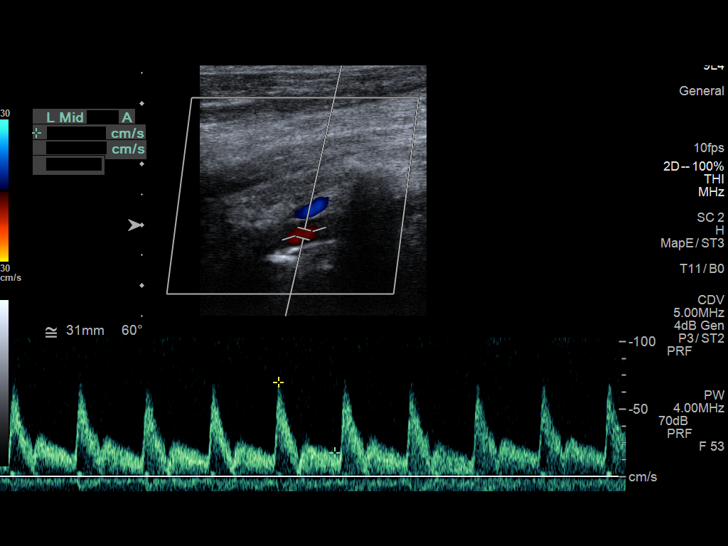

[13 of 24 positions shown; findings below may reference images not displayed]

FINDINGS: Criteria: Quantification of carotid stenosis is based on velocity
parameters that correlate the residual internal carotid diameter
with NASCET-based stenosis levels, using the diameter of the distal
internal carotid lumen as the denominator for stenosis measurement.

The following velocity measurements were obtained:

RIGHT

ICA:  Systolic 86 cm/sec, Diastolic 32 cm/sec

CCA:  129 cm/sec

SYSTOLIC ICA/CCA RATIO:

ECA:  152 cm/sec

LEFT

ICA:  Systolic 187 cm/sec, Diastolic 44 cm/sec

CCA:  130 cm/sec

SYSTOLIC ICA/CCA RATIO:

ECA:  141 cm/sec

Right Brachial SBP: 122

Left Brachial SBP: 116

RIGHT CAROTID ARTERY: No significant calcifications of the right
common carotid artery. Intermediate waveform maintained.
Heterogeneous and partially calcified plaque at the right carotid
bifurcation. No significant lumen shadowing. Low resistance waveform
of the right ICA. No significant tortuosity.

RIGHT VERTEBRAL ARTERY: Antegrade flow with low resistance waveform.

LEFT CAROTID ARTERY: No significant calcifications of the left
common carotid artery. Intermediate waveform maintained.
Heterogeneous and partially calcified plaque at the left carotid
bifurcation without significant lumen shadowing. Low resistance
waveform of the left ICA. No significant tortuosity.

LEFT VERTEBRAL ARTERY:  Antegrade flow with low resistance waveform.
IMPRESSION: Right:

Color duplex indicates minimal heterogeneous and calcified plaque,
with no hemodynamically significant stenosis by duplex criteria in
the extracranial cerebrovascular circulation.

Left:

Heterogeneous and partially calcified plaque at the left carotid
bifurcation, with discordant results regarding degree of stenosis by
established duplex criteria. Peak velocity suggests 50%-69%
stenosis, with the ICA/ CCA ratio suggesting a lesser degree of
stenosis. If establishing a more accurate degree of stenosis is
required, cerebral angiogram should be considered, or as a second
best test, CTA.

## 2022-05-10 ENCOUNTER — Other Ambulatory Visit: Payer: Self-pay | Admitting: Family Medicine

## 2022-05-10 DIAGNOSIS — I6523 Occlusion and stenosis of bilateral carotid arteries: Secondary | ICD-10-CM

## 2022-06-06 ENCOUNTER — Other Ambulatory Visit: Payer: Managed Care, Other (non HMO)

## 2022-06-06 ENCOUNTER — Ambulatory Visit
Admission: RE | Admit: 2022-06-06 | Discharge: 2022-06-06 | Disposition: A | Discharge status: Home / Self Care | Payer: Managed Care, Other (non HMO) | Source: Ambulatory Visit | Attending: Family Medicine | Admitting: Family Medicine

## 2022-06-06 DIAGNOSIS — I6523 Occlusion and stenosis of bilateral carotid arteries: Secondary | ICD-10-CM

## 2022-07-11 NOTE — Progress Notes (Signed)
[facility] [place]  ASSESSMENT / PLAN: Calvin Macias is a 62 y.o. male with mild bilateral carotid artery stenosis; asymetric upper extremity blood pressures concerning for subclavian artery stenosis. This is causing ***.   Recommend: Complete cessation from all tobacco products. Blood glucose control with goal A1c < 7%. Blood pressure control with goal blood pressure < 140/90 mmHg. Lipid reduction therapy with goal LDL-C <100 mg/dL (<70 if symptomatic from PAD).  Aspirin 81mg  PO QD.  Atorvastatin 40-80mg  PO QD (or other "high intensity" statin therapy).  Check CT angiogram of chest to evaluate for possible subclavian artery stenosis. I will call the patient in the coming weeks after the scan has been performed to review next steps.   CHIEF COMPLAINT: ***  HISTORY OF PRESENT ILLNESS: Calvin Macias is a 62 y.o. male ***  VASCULAR SURGICAL HISTORY: ***  VASCULAR RISK FACTORS: {FINDINGS; POSITIVE NEGATIVE:202-696-2997} history of stroke / transient ischemic attack. {FINDINGS; POSITIVE NEGATIVE:202-696-2997} history of coronary artery disease. *** history of PCI. *** history of CABG.  {FINDINGS; POSITIVE NEGATIVE:202-696-2997} history of diabetes mellitus. Last A1c ***. {FINDINGS; POSITIVE NEGATIVE:202-696-2997} history of smoking. *** actively smoking. {FINDINGS; POSITIVE NEGATIVE:202-696-2997} history of hypertension. *** drug regimen with *** control. {FINDINGS; POSITIVE NEGATIVE:202-696-2997} history of chronic kidney disease.  Last GFR ***. CKD {stage:30421363}. {FINDINGS; POSITIVE NEGATIVE:202-696-2997} history of chronic obstructive pulmonary disease, treated with ***.  FUNCTIONAL STATUS: ECOG performance status: {findings; ecog performance status:31780} Ambulatory status: {TNHAmbulation:25868}  CAREY 1 AND 3 YEAR INDEX Male (2pts) 75-79 or 80-84 (2pts) >84 (3pts) Dependence in toileting (1pt) Partial or full dependence in dressing (1pt) History of malignant  neoplasm (2pts) CHF (3pts) COPD (1pts) CKD (3pts)  0-3 pts 6% 1 year mortality ; 21% 3 year mortality 4-5 pts 12% 1 year mortality ; 36% 3 year mortality >5 pts 21% 1 year mortality; 54% 3 year mortality   Past Medical History:  Diagnosis Date   Diabetes mellitus, type 2 (HCC)    Diabetic neuropathy ([place])    Hyperlipidemia    Hypertension     No past surgical history on file.  Family History  Problem Relation Age of Onset   Hyperlipidemia Mother    Diabetes Father     Social History   Socioeconomic History   Marital status: Widowed    Spouse name: Not on file   Number of children: Not on file   Years of education: Not on file   Highest education level: Not on file  Occupational History   Not on file  Tobacco Use   Smoking status: Never   Smokeless tobacco: Never  Substance and Sexual Activity   Alcohol use: No   Drug use: No   Sexual activity: Not on file  Other Topics Concern   Not on file  Social History Narrative   Not on file   Social Determinants of Health   Financial Resource Strain: Not on file  Food Insecurity: Not on file  Transportation Needs: Not on file  Physical Activity: Not on file  Stress: Not on file  Social Connections: Not on file  Intimate Partner Violence: Not on file    No Known Allergies  Current Outpatient Medications  Medication Sig Dispense Refill   aspirin 81 MG tablet Take 81 mg by mouth every morning.     benazepril (LOTENSIN) 20 MG tablet      glipiZIDE (GLUCOTROL) 10 MG tablet Take 10 tablets by mouth 2 (two) times daily.     hydrochlorothiazide (MICROZIDE) 12.5  MG capsule      INVOKANA 100 MG TABS Take 100 mg by mouth daily.     metFORMIN (GLUCOPHAGE) 1000 MG tablet Take 1,000 mg by mouth 2 (two) times daily.     Multiple Vitamins-Minerals (MENS LIFE PACK) TABS Take 1 each by mouth daily.     mupirocin cream (BACTROBAN) 2 % Apply 1 application topically daily. Apply to the foot daily 30 g 5   pioglitazone (ACTOS)  45 MG tablet Take 45 mg by mouth daily.     simvastatin (ZOCOR) 40 MG tablet Take 40 mg by mouth daily.     TRULICITY 1.5 MG/0.5ML SOPN      VICTOZA 18 MG/3ML SOPN Inject 1.8 mg into the skin daily.     No current facility-administered medications for this visit.    PHYSICAL EXAM There were no vitals filed for this visit.  Constitutional: *** appearing. *** distress. Appears *** nourished.  Neurologic: CN ***. *** focal findings. *** sensory loss. Psychiatric: *** Mood and affect symmetric and appropriate. Eyes: *** No icterus. No conjunctival pallor. Ears, nose, throat: *** mucous membranes moist. Midline trachea.  Cardiac: *** rate and rhythm.  Respiratory: *** unlabored. Abdominal: *** soft, non-tender, non-distended.  Peripheral vascular: *** Extremity: *** edema. *** cyanosis. *** pallor.  Skin: *** gangrene. *** ulceration.  Lymphatic: *** Stemmer's sign. *** palpable lymphadenopathy.    PERTINENT LABORATORY AND RADIOLOGIC DATA  Most recent CBC    Latest Ref Rng & Units 03/24/2014    7:40 AM 03/23/2014    9:18 AM 03/22/2014   11:40 AM  CBC  WBC 4.0 - 10.5 K/uL 12.7  13.0  13.4   Hemoglobin 13.0 - 17.0 g/dL 87.8  67.6  9.7   Hematocrit 39.0 - 52.0 % 32.4  29.6  27.8   Platelets 150 - 400 K/uL 320  281  248      Most recent CMP    Latest Ref Rng & Units 03/24/2014    7:40 AM 03/23/2014    9:18 AM 03/22/2014    7:05 AM  CMP  Glucose 70 - 99 mg/dL 720  947  096   BUN 6 - 23 mg/dL 19  21  24    Creatinine 0.50 - 1.35 mg/dL  2.83  6.62   Sodium 137 - 147 mEq/L 141  137  140   Potassium 3.7 - 5.3 mEq/L 4.6  5.1  4.9   Chloride 96 - 112 mEq/L 102  101  106   CO2 19 - 32 mEq/L 22  19  16    Calcium 8.4 - 10.5 mg/dL 9.7  9.2  8.9     Renal function CrCl cannot be calculated (Patient's most recent lab result is older than the maximum 21 days allowed.).  Hgb A1c MFr Bld (%)  Date Value  03/20/2014 7.5 (H)    No results found for: "LDLCALC", "LDLC",  "HIRISKLDL", "POCLDL", "LDLDIRECT", "REALLDLC", "TOTLDLC"   Carotid duplex: RIGHT CAROTID ARTERY: No significant calcifications of the right common carotid artery. Intermediate waveform maintained. Moderate heterogeneous and partially calcified plaque at the right carotid bifurcation. No significant lumen shadowing. Low resistance waveform of the right ICA. No significant tortuosity.   RIGHT VERTEBRAL ARTERY: Antegrade flow with low resistance waveform.   LEFT CAROTID ARTERY: No significant calcifications of the left common carotid artery. Intermediate waveform maintained. Moderate heterogeneous and partially calcified plaque at the left carotid bifurcation. No significant lumen shadowing. Low resistance waveform of the left ICA. No significant tortuosity.  LEFT VERTEBRAL ARTERY:  Antegrade flow with low resistance waveform.   IMPRESSION: Color duplex indicates minimal heterogeneous and calcified plaque, with no hemodynamically significant stenosis by duplex criteria in the extracranial cerebrovascular circulation.   Asymmetry of the upper extremity blood pressures, significantly lower on the left. This could indicate subclavian artery stenosis. Correlation with office based blood pressure assessment may be useful.  Calvin Macias. Stanford Breed, MD FACS [facility] Phone Number: [phone] 07/11/2022 11:57 AM   Total time spent on preparing this encounter including chart review, data review, collecting history, examining the patient, coordinating care for this {tnhtimebilling:26202}  Portions of this report may have been transcribed using voice recognition software.  Every effort has been made to ensure accuracy; however, inadvertent computerized transcription errors may still be present.

## 2022-07-12 ENCOUNTER — Encounter: Payer: Self-pay | Admitting: Vascular Surgery

## 2022-07-12 ENCOUNTER — Ambulatory Visit: Payer: Managed Care, Other (non HMO) | Admitting: Vascular Surgery

## 2022-07-12 VITALS — BP 111/56 | HR 80 | Temp 98.1°F | Resp 20 | Ht 73.0 in | Wt 230.0 lb

## 2022-07-12 DIAGNOSIS — I998 Other disorder of circulatory system: Secondary | ICD-10-CM | POA: Diagnosis not present

## 2022-11-18 ENCOUNTER — Other Ambulatory Visit: Payer: Self-pay

## 2022-11-18 MED ORDER — MOUNJARO 10 MG/0.5ML ~~LOC~~ SOAJ
10.0000 mg | SUBCUTANEOUS | 1 refills | Status: AC
  Filled 2022-11-18: qty 2, 28d supply, fill #0
  Filled 2022-12-23: qty 2, 28d supply, fill #1

## 2022-11-21 ENCOUNTER — Other Ambulatory Visit: Payer: Self-pay

## 2022-11-21 NOTE — Progress Notes (Signed)
   Calvin Macias 02-26-1961 [id number]  Patient seen by Haze Boyden, PharmD Candidate and Seward Meth, PharmD Candidate on 11/21/22 while they were picking up prescriptions at [facility].  Blood Pressure Readings: Systolic BP today: 134 Diastolic BP today: 72 Does the patient have a validated home blood pressure machine?: Yes   Medication review was performed. Is the patient taking their medications as prescribed?: Yes   The following barriers to adherence were noted: Does the patient have cost concerns?: No Does the patient have transportation concerns?: No Does the patient need assistance obtaining refills?: No Does the patient occassionally forget to take some of their prescribed medications?: No Does the patient feel like one/some of their medications make them feel poorly?: No Does the patient have questions or concerns about their medications?: No Does the patient have a follow up scheduled with their primary care provider/cardiologist?: No   Interventions: Interventions Completed: Patient was educated on goal blood pressures and long term health implications of elevated blood pressure, Patient was educated on medications, including indication and administration, Patient was educated on how to access home blood pressure machine, Patient was educated on proper technique to check home blood pressure and reminded to bring home machine and readings to next provider appointment  The patient has follow up scheduled:  PCP: Shirlean Mylar, MD   Haze Boyden, Student-PharmD

## 2022-12-23 ENCOUNTER — Other Ambulatory Visit: Payer: Self-pay

## 2024-07-22 ENCOUNTER — Ambulatory Visit: Admitting: Podiatry

## 2024-07-22 VITALS — Ht 73.0 in | Wt 168.0 lb

## 2024-07-22 DIAGNOSIS — Z0189 Encounter for other specified special examinations: Secondary | ICD-10-CM

## 2024-07-22 DIAGNOSIS — M79674 Pain in right toe(s): Secondary | ICD-10-CM | POA: Diagnosis not present

## 2024-07-22 DIAGNOSIS — B351 Tinea unguium: Secondary | ICD-10-CM | POA: Diagnosis not present

## 2024-07-22 DIAGNOSIS — M79675 Pain in left toe(s): Secondary | ICD-10-CM

## 2024-07-22 DIAGNOSIS — E119 Type 2 diabetes mellitus without complications: Secondary | ICD-10-CM

## 2024-07-22 NOTE — Progress Notes (Signed)
" ° °  Chief Complaint  Patient presents with   Nail Problem    RM 9 RFC    SUBJECTIVE Patient with a history of diabetes mellitus presents to office today complaining of elongated, thickened nails that cause pain while ambulating in shoes.  Patient is unable to trim their own nails. Patient is here for further evaluation and treatment.  Past Medical History:  Diagnosis Date   Diabetes mellitus, type 2 (HCC)    Diabetic neuropathy (HCC)    Hyperlipidemia    Hypertension     Allergies[1]   OBJECTIVE General Patient is awake, alert, and oriented x 3 and in no acute distress. Derm Skin is dry and supple bilateral. Negative open lesions or macerations. Remaining integument unremarkable. Nails are tender, long, thickened and dystrophic with subungual debris, consistent with onychomycosis, 1-5 bilateral. No signs of infection noted. Vasc  DP and PT pedal pulses palpable bilaterally. Temperature gradient within normal limits.  Neuro Epicritic and protective threshold sensation diminished bilaterally.  Musculoskeletal Exam No symptomatic pedal deformities noted bilateral. Muscular strength within normal limits.  ASSESSMENT 1. Diabetes Mellitus w/ peripheral neuropathy 2.  Pain due to onychomycosis of toenails bilateral 3.  Encounter for diabetic foot exam  PLAN OF CARE 1. Patient evaluated today.  Comprehensive diabetic foot exam performed today 2. Instructed to maintain good pedal hygiene and foot care. Stressed importance of controlling blood sugar.  3. Mechanical debridement of nails 1-5 bilaterally performed using a nail nipper. Filed with dremel without incident.  4. Return to clinic PRN   Thresa EMERSON Sar, DPM [facility]  Dr. Thresa EMERSON Sar, DPM    [address] [place], [place] [address]                Office [phone]  Fax [phone]         [1] No Known Allergies  "
